# Patient Record
Sex: Female | Born: 1960 | Race: White | Hispanic: No | Smoking: Former smoker
Health system: Southern US, Community
[De-identification: ages and names within clinical notes are randomized; demographics above are authoritative.]

## PROBLEM LIST (undated history)

## (undated) DIAGNOSIS — M1990 Unspecified osteoarthritis, unspecified site: Secondary | ICD-10-CM

## (undated) DIAGNOSIS — S43109A Unspecified dislocation of unspecified acromioclavicular joint, initial encounter: Secondary | ICD-10-CM

## (undated) DIAGNOSIS — D649 Anemia, unspecified: Secondary | ICD-10-CM

## (undated) HISTORY — PX: COLONOSCOPY: SHX174

## (undated) HISTORY — PX: EYE SURGERY: SHX253

## (undated) HISTORY — PX: REFRACTIVE SURGERY: SHX103

---

## 2003-10-13 ENCOUNTER — Other Ambulatory Visit: Admission: RE | Admit: 2003-10-13 | Discharge: 2003-10-13 | Payer: Self-pay | Admitting: Family Medicine

## 2003-10-22 ENCOUNTER — Ambulatory Visit (HOSPITAL_COMMUNITY): Admission: RE | Admit: 2003-10-22 | Discharge: 2003-10-22 | Payer: Self-pay | Admitting: Family Medicine

## 2005-11-09 ENCOUNTER — Observation Stay (HOSPITAL_COMMUNITY): Admission: AD | Admit: 2005-11-09 | Discharge: 2005-11-10 | Payer: Self-pay | Admitting: Obstetrics and Gynecology

## 2005-11-09 ENCOUNTER — Ambulatory Visit: Payer: Self-pay | Admitting: Family Medicine

## 2005-11-16 ENCOUNTER — Ambulatory Visit: Payer: Self-pay | Admitting: Family Medicine

## 2005-11-23 ENCOUNTER — Ambulatory Visit: Payer: Self-pay | Admitting: Family Medicine

## 2005-11-25 ENCOUNTER — Encounter (INDEPENDENT_AMBULATORY_CARE_PROVIDER_SITE_OTHER): Payer: Self-pay | Admitting: Specialist

## 2005-11-25 ENCOUNTER — Inpatient Hospital Stay (HOSPITAL_COMMUNITY): Admission: RE | Admit: 2005-11-25 | Discharge: 2005-11-27 | Payer: Self-pay | Admitting: Obstetrics and Gynecology

## 2005-12-02 ENCOUNTER — Encounter: Admission: RE | Admit: 2005-12-02 | Discharge: 2005-12-31 | Payer: Self-pay | Admitting: Obstetrics and Gynecology

## 2006-01-01 ENCOUNTER — Encounter: Admission: RE | Admit: 2006-01-01 | Discharge: 2006-01-31 | Payer: Self-pay | Admitting: Obstetrics and Gynecology

## 2006-02-01 ENCOUNTER — Encounter: Admission: RE | Admit: 2006-02-01 | Discharge: 2006-03-03 | Payer: Self-pay | Admitting: Obstetrics and Gynecology

## 2006-03-04 ENCOUNTER — Encounter: Admission: RE | Admit: 2006-03-04 | Discharge: 2006-04-02 | Payer: Self-pay | Admitting: Obstetrics and Gynecology

## 2006-04-03 ENCOUNTER — Encounter: Admission: RE | Admit: 2006-04-03 | Discharge: 2006-04-21 | Payer: Self-pay | Admitting: Obstetrics and Gynecology

## 2006-07-05 ENCOUNTER — Ambulatory Visit (HOSPITAL_COMMUNITY): Admission: RE | Admit: 2006-07-05 | Discharge: 2006-07-05 | Payer: Self-pay | Admitting: Family Medicine

## 2007-04-26 ENCOUNTER — Other Ambulatory Visit: Admission: RE | Admit: 2007-04-26 | Discharge: 2007-04-26 | Payer: Self-pay | Admitting: Family Medicine

## 2007-08-06 ENCOUNTER — Ambulatory Visit (HOSPITAL_COMMUNITY): Admission: RE | Admit: 2007-08-06 | Discharge: 2007-08-06 | Payer: Self-pay | Admitting: Family Medicine

## 2010-10-28 NOTE — H&P (Signed)
NAME:  Mary Mahoney, Mary Mahoney               ACCOUNT NO.:  [id number]   MEDICAL RECORD NO.:  [id number]          PATIENT TYPE:  INP   LOCATION:                                FACILITY:  [facility]   PHYSICIAN:  Richardean Sale, M.D.   DATE OF BIRTH:  08-17-1960   DATE OF ADMISSION:  11/25/2005  DATE OF DISCHARGE:                                HISTORY & PHYSICAL   ADMISSION DIAGNOSIS:  36-1/2 week twin intrauterine pregnancy with  intrauterine growth retardation baby B for induction of labor.   HISTORY OF PRESENT ILLNESS:  This is a 50 year old, gravida 3, para 1-0-1-1,  white female with spontaneous conception of a twin intrauterine gestation  monochorionic, diamniotic who presents for induction of labor. Prenatal care  has been at [facility] with Dr. Richardean Sale as the primary  attending. The pregnancy has been uncomplicated up until 34 weeks when the  patient began to have preterm contractions. Ultrasound at that time showed  12% discordance with baby B at the 10th percentile. Up until that point,  growth had been concordant. The patient presented to the office yesterday  for a followup ultrasound for growth and was found to have a 25% discordance  with baby B at the 3rd percentile consistent with IUGR. Amniotic fluid was  normal for both and umbilical cord Doppler was normal for baby A and a the  high end of normal for baby B. The patient received betamethasone at 34  weeks secondary to preterm contractions. Group B beta strep was negative.  Ultrasound yesterday showed vertex-vertex presentation. She presents today  for induction of labor secondary to IUGR of baby B. Fetal surveillance has  been reassuring throughout the pregnancy. Given the patient's advanced  maternal age and twin gestation, the patient underwent consultation at the  [facility] and underwent amniocentesis which showed normal  chromosomes, both female and normal AFP CF screen ws negative. The patient  reports  good fetal movement x2, denies loss of fluid, vaginal bleeding and  complains of just occasional contractions.   REVIEW OF SYSTEMS:  Negative for chest pain, shortness of breath, fever,  chills, sweats, abdominal pain, headache or visual changes.   PRENATAL LABS:  Blood type is A positive, antibody screen negative, rubella  immune, HIV nonreactive, gonorrhea and chlamydia screen negative,  toxoplasmosis screen negative, hepatitis B negative, RPR nonreactive, group  B beta strep negative, one-hour Glucola 118, 28 week hemoglobin 12.4,  hematocrit 36, platelet count 192.   PHYSICAL EXAMINATION:  VITAL SIGNS:  She is afebrile, vital signs are  stable.  GENERAL:  She is a well-developed, well-nourished, white female who appears  in no acute distress. Height is 5 foot 0, weight 159 pounds with a 44 pound  weight gain this pregnancy.  HEART:  Regular rate and rhythm.  LUNGS:  Clear to auscultation bilaterally.  ABDOMEN:  Gravida, soft and nontender.  EXTREMITIES:  No cyanosis, clubbing or pain. Trace edema in the feet  bilaterally.  NEUROLOGIC:  Nonfocal.  CERVIX:  Fingertip 70%, 0 station, vertex, ultrasound confirms vertex-vertex  presentation. Fetal heart rate tracing  is reactive x2 with no decelerations.  Both baby fetuses are able to be traced continuously.   MEDICATIONS:  Prenatal vitamins, folic acid, Ambien p.r.n.   ALLERGIES:  No known drug allergies.   OBSTETRIC HISTORY:  Spontaneous vaginal delivery in 1982, 7 pounds, no  complications. Second pregnancy TAB in 1994, no complications.  Remote  history of abnormal Pap smear, no cone, LEEP or cryo to the cervix.   SOCIAL HISTORY:  Married, denies tobacco, alcohol or drugs.   FAMILY HISTORY:  No history of breast or ovarian cancer. No history of birth  defects, congenital anomalies, cystic fibrosis, mental retardation or  chromosomal abnormalities. Positive for diabetes, coronary artery disease,  hypertension, stroke, in  distant relatives.   ASSESSMENT:  A 50 year old, gravida 3, para 1-0-1-1 white female whose at 79-  1/[redacted] weeks gestation with due date of December 20, 2005 by first trimester  ultrasound who presents for induction of labor secondary to intrauterine  growth retardation of baby B. Vertex-vertex presentation on ultrasound.   PLAN:  1.  Indications for induction reviewed with patient and husband in detail.      The patient and her husband voice understanding. Given vertex-vertex      presentation, the patient would like to attempt vaginal delivery.  2.  Continuous fetal monitoring.  3.  Will start low dose Pitocin and perform amniotomy. Place fetal scalp      electrode if needed.  4.  Plan for delivery in the OR with double setup in the event that      emergency C section of baby B is needed.  5.  Discussed with patient the possibility of baby B converting to breech      given that baby B is the smaller of the two and the patient has a proven      pelvis of 7 pounds, she would like to pursue vaginal breech delivery as      long as there is no fetal intolerance of labor.  6.  Desires permanent sterilization. Will plan for postpartum tubal ligation      after delivery.  7.  Estimated fetal weight on recent ultrasound shows baby A at      approximately 5-1/2 pounds and baby B at 4-1/2 pounds.      Richardean Sale, M.D.  Electronically Signed     JW/MEDQ  D:  11/25/2005  T:  11/25/2005  Job:  [id number]

## 2010-10-28 NOTE — H&P (Signed)
NAME:  Mary Mahoney, Mary Mahoney               ACCOUNT NO.:  [id number]   MEDICAL RECORD NO.:  [id number]          PATIENT TYPE:  INP   LOCATION:  9154                          FACILITY:  [facility]   PHYSICIAN:  Richardean Sale, M.D.   DATE OF BIRTH:  09/07/1960   DATE OF ADMISSION:  11/09/2005  DATE OF DISCHARGE:                                HISTORY & PHYSICAL   ADMITTING DIAGNOSES:  A 34-week intrauterine pregnancy, twin gestation, with  preterm contractions.   HISTORY OF PRESENT ILLNESS:  This is a 50 year old gravida 3, para 1-0-1-1  white female who is at 104 weeks gestation with a known twin intrauterine  pregnancy, diamniotic monochorionic.  The patient's prenatal care has been  at [facility] with Dr. Richardean Sale as the attending, and has been  uncomplicated.  The patient underwent fetal monitoring today, given a 12%  gross discrepancy between the twins, and baby B with an estimated fetal  weight at the 10th percentile.  Both fetal heart rate tracings were  reactive, but the patient was found to be contracting every 2 minutes.  The  patient denies any regular contractions, but has felt occasional menstrual  cramps.  Denies any bleeding, any loss of fluid, or change in vaginal  discharge.  On evaluation in maternity admissions, her cervix as fingertip  dilated, 80% effaced, -1 station posterior, which was a subtle change from  her examination 2 days ago, at which time her cervix was closed.  The  patient underwent observation in maternity admissions, received 1 dose of  Procardia 10 mg and 1 dose of terbutaline, with only a slight decrease in  the frequency of her contractions.  Throughout the monitoring the patient  was able to recognize the contractions, and stated they felt like mild  menstrual cramps.  After the dose of terbutaline, the patient was no longer  able to feel any cramping, but continued to have contractions.  She is being  admitted for observation for rule out preterm  labor.   PAST MEDICAL HISTORY:  No chronic diseases or prior hospitalizations, except  for childbirth.   PAST SURGICAL HISTORY:  None.   OBSTETRIC HISTORY:  Spontaneous vaginal delivery at 42 weeks, uncomplicated,  and first trimester TAB.   GYNECOLOGIC HISTORY:  Remote history of an abnormal Pap smear with no  treatment needed.  No history of gonorrhea, Chlamydia or herpes.   FAMILY HISTORY:  Positive for hypertension, diabetes, coronary artery  disease, emphysema, colon cancer, CVA.  No history of birth defects,  congenital anomalies, Down syndrome, spina bifida or cystic fibrosis.   SOCIAL HISTORY:  Denies tobacco, alcohol or drugs.  She is Estate manager/land agent,  and is an active runner.   MEDICATIONS:  1.  Prenatal vitamins.  2.  Folic acid 1 mg daily.   ALLERGIES:  No known drug allergies.   PHYSICAL EXAMINATION:  VITAL SIGNS:  She is afebrile.  Her vital signs are  stable.  GENERAL:  She is a well-developed, well-nourished, white female who is in no  acute distress.  HEART:  Regular rate and rhythm.  LUNGS:  Clear  to auscultation bilaterally.  ABDOMEN:  Gravid, soft, nontender.  EXTREMITIES:  Only trace edema in the feet bilaterally.  Nontender.  NEUROLOGIC:  Nonfocal.  PELVIC:  Cervix is fingertip, 80%, -1 station, posterior and vertex.  Fetal  heart rate tracing showed a reactive fetal heart rate tracing x2, and  contractions occurring every 2-3 minutes.   PRENATAL LABORATORY STUDIES:  Cystic fibrosis carrier screen negative.  Amniocentesis revealed normal fetal karyotype x2 and normal AFP value x2.  Hepatitis B surface antigen nonreactive.  Blood type A positive.  Antibody  screen negative.  HIV nonreactive.  RPR nonreactive.  Rubella immune.  Urine  culture negative.  Toxoplasmosis screen negative.  Varicella IgG positive,  consistent with prior varicella infections.  One-hour Glucola 118, 28-week  CBC normal with hemoglobin of 12.4, platelet count 192.  Group B beta  strep  is pending.  The most recent ultrasound shows vertex-vertex presentation  with a 12% growth discordance.  Amniotic fluid normal x2.  Baby B at the 9th  to 10th percentile.  Baby A is AGA.  Doppler studies normal x2 on Nov 07, 2005.   ASSESSMENT:  A 50 year old gravida 3, para 1-0-1-1 white female who is at 107  weeks gestation with a diamniotic monochorionic twin gestation with preterm  contractions.   PLAN:  1.  Will admit for observation for rule out preterm labor.  2.  Given patient's gestation of only 34 weeks, will administer      betamethasone 12.5 mg IM x1 now and repeat in 24 hours.  3.  Will start Procardia 10 mg p.o. q.6h. and continue after 48 hours to      achieve steroid time.  4.  Vertex-vertex presentation.  Will plan for vaginal delivery in the OR if      delivery is indicated and fetal heart rate tracing is reactive and able      to trace throughout labor.  5.  Group B beta strep pending.  Will start ampicillin 2 gm IV q.6h. until      results available.  6.  Multiparity.  Advanced maternal age.  The patient requests tubal      sterilization.  Given her multiparity, we discussed the risks, benefits,      and alternatives and possible failure rate of up to 1%.  The patient      voices understanding of the above and desires to proceed with tubal      ligation after delivery.  7.  Daily fetal monitoring.      Richardean Sale, M.D.  Electronically Signed     JW/MEDQ  D:  11/09/2005  T:  11/10/2005  Job:  [id number]

## 2012-04-02 ENCOUNTER — Other Ambulatory Visit: Payer: Self-pay | Admitting: Family Medicine

## 2012-04-02 ENCOUNTER — Other Ambulatory Visit (HOSPITAL_COMMUNITY)
Admission: RE | Admit: 2012-04-02 | Discharge: 2012-04-02 | Disposition: A | Discharge status: Home / Self Care | Payer: Managed Care, Other (non HMO) | Source: Ambulatory Visit | Attending: Family Medicine | Admitting: Family Medicine

## 2012-04-02 ENCOUNTER — Other Ambulatory Visit (HOSPITAL_COMMUNITY): Payer: Self-pay | Admitting: Family Medicine

## 2012-04-02 DIAGNOSIS — Z1231 Encounter for screening mammogram for malignant neoplasm of breast: Secondary | ICD-10-CM

## 2012-04-02 DIAGNOSIS — Z124 Encounter for screening for malignant neoplasm of cervix: Secondary | ICD-10-CM | POA: Insufficient documentation

## 2012-04-03 LAB — TSH: TSH: 1.59 (ref <=5.90)

## 2012-04-19 ENCOUNTER — Ambulatory Visit (HOSPITAL_COMMUNITY)
Admission: RE | Admit: 2012-04-19 | Discharge: 2012-04-19 | Disposition: A | Discharge status: Home / Self Care | Payer: Managed Care, Other (non HMO) | Source: Ambulatory Visit | Attending: Family Medicine | Admitting: Family Medicine

## 2012-04-19 DIAGNOSIS — Z1231 Encounter for screening mammogram for malignant neoplasm of breast: Secondary | ICD-10-CM | POA: Insufficient documentation

## 2012-04-25 ENCOUNTER — Other Ambulatory Visit: Payer: Self-pay | Admitting: Family Medicine

## 2012-04-25 DIAGNOSIS — R928 Other abnormal and inconclusive findings on diagnostic imaging of breast: Secondary | ICD-10-CM

## 2012-05-01 ENCOUNTER — Other Ambulatory Visit: Payer: Self-pay | Admitting: Family Medicine

## 2012-05-01 ENCOUNTER — Ambulatory Visit
Admission: RE | Admit: 2012-05-01 | Discharge: 2012-05-01 | Disposition: A | Discharge status: Home / Self Care | Payer: Managed Care, Other (non HMO) | Source: Ambulatory Visit | Attending: Family Medicine | Admitting: Family Medicine

## 2012-05-01 DIAGNOSIS — R928 Other abnormal and inconclusive findings on diagnostic imaging of breast: Secondary | ICD-10-CM

## 2012-05-14 ENCOUNTER — Ambulatory Visit
Admission: RE | Admit: 2012-05-14 | Discharge: 2012-05-14 | Disposition: A | Discharge status: Home / Self Care | Payer: Managed Care, Other (non HMO) | Source: Ambulatory Visit | Attending: Family Medicine | Admitting: Family Medicine

## 2012-05-14 DIAGNOSIS — R928 Other abnormal and inconclusive findings on diagnostic imaging of breast: Secondary | ICD-10-CM

## 2014-07-31 ENCOUNTER — Other Ambulatory Visit: Payer: Self-pay | Admitting: Family Medicine

## 2014-07-31 ENCOUNTER — Other Ambulatory Visit (HOSPITAL_COMMUNITY)
Admission: RE | Admit: 2014-07-31 | Discharge: 2014-07-31 | Disposition: A | Discharge status: Home / Self Care | Payer: 59 | Source: Ambulatory Visit | Attending: Family Medicine | Admitting: Family Medicine

## 2014-07-31 DIAGNOSIS — Z124 Encounter for screening for malignant neoplasm of cervix: Secondary | ICD-10-CM | POA: Diagnosis not present

## 2014-07-31 DIAGNOSIS — Z1151 Encounter for screening for human papillomavirus (HPV): Secondary | ICD-10-CM | POA: Diagnosis present

## 2014-07-31 LAB — HEPATIC FUNCTION PANEL: BILIRUBIN, TOTAL: 0.7

## 2014-07-31 LAB — BASIC METABOLIC PANEL
BUN: 28 — AB (ref 4–21)
CREATININE: 0.8 (ref 0.5–1.1)
Glucose: 86
Sodium: 139 (ref 137–147)

## 2014-07-31 LAB — LIPID PANEL
CHOLESTEROL: 213 — AB (ref 0–200)
HDL: 64 (ref 35–70)
LDL Cholesterol: 135
Triglycerides: 70 (ref 40–160)

## 2014-08-03 LAB — CYTOLOGY - PAP

## 2014-09-22 HISTORY — PX: COLONOSCOPY: SHX174

## 2014-09-22 LAB — HM COLONOSCOPY

## 2015-03-16 ENCOUNTER — Other Ambulatory Visit (HOSPITAL_COMMUNITY): Payer: Self-pay | Admitting: Orthopaedic Surgery

## 2015-04-08 NOTE — Pre-Procedure Instructions (Signed)
Annabella L [place]  04/08/2015     Your procedure is scheduled on : Wednesday April 21, 2015 at 12:45 PM.  [facility] 10:45 AM.  Call this number if you have problems the morning of surgery: [phone]    Remember:  Do not eat food or drink liquids after midnight.  Take these medicines the morning of surgery with A SIP OF WATER : NONE   Stop taking any vitamins, herbal medications, Naproxen/Aleve, Fish oil, etc on Wednesday November 2nd   Do not wear jewelry, make-up or nail polish.  Do not wear lotions, powders, or perfumes.    Do not shave 48 hours prior to surgery.    Do not bring valuables to the hospital.  [facility] is not responsible for any belongings or valuables.  Contacts, dentures or bridgework may not be worn into surgery.  Leave your suitcase in the car.  After surgery it may be brought to your room.  For patients admitted to the hospital, discharge time will be determined by your treatment team.  Patients discharged the day of surgery will not be allowed to drive home.   Name and phone number of your driver:    Special instructions:  Shower using CHG soap the night before and the morning of your surgery  Please read over the following fact sheets that you were given. Pain Booklet, Coughing and Deep Breathing, Blood Transfusion Information, Total Joint Packet, MRSA Information and Surgical Site Infection Prevention

## 2015-04-09 ENCOUNTER — Encounter (HOSPITAL_COMMUNITY)
Admission: RE | Admit: 2015-04-09 | Discharge: 2015-04-09 | Disposition: A | Discharge status: Home / Self Care | Payer: 59 | Source: Ambulatory Visit | Attending: Orthopaedic Surgery | Admitting: Orthopaedic Surgery

## 2015-04-09 ENCOUNTER — Encounter (HOSPITAL_COMMUNITY): Payer: Self-pay

## 2015-04-09 DIAGNOSIS — Z0183 Encounter for blood typing: Secondary | ICD-10-CM | POA: Insufficient documentation

## 2015-04-09 DIAGNOSIS — M1611 Unilateral primary osteoarthritis, right hip: Secondary | ICD-10-CM | POA: Insufficient documentation

## 2015-04-09 DIAGNOSIS — Z01812 Encounter for preprocedural laboratory examination: Secondary | ICD-10-CM | POA: Insufficient documentation

## 2015-04-09 LAB — TYPE AND SCREEN
ABO/RH(D): A POS
ANTIBODY SCREEN: NEGATIVE

## 2015-04-09 LAB — COMPREHENSIVE METABOLIC PANEL
ALK PHOS: 53 U/L (ref 38–126)
ALT: 22 U/L (ref 14–54)
ANION GAP: 7 (ref 5–15)
AST: 29 U/L (ref 15–41)
Albumin: 4.4 g/dL (ref 3.5–5.0)
BUN: 23 mg/dL — AB (ref 6–20)
CHLORIDE: 102 mmol/L (ref 101–111)
CO2: 27 mmol/L (ref 22–32)
Calcium: 9.3 mg/dL (ref 8.9–10.3)
Creatinine, Ser: 0.87 mg/dL (ref 0.44–1.00)
Glucose, Bld: 86 mg/dL (ref 65–99)
POTASSIUM: 3.9 mmol/L (ref 3.5–5.1)
Sodium: 136 mmol/L (ref 135–145)
Total Bilirubin: 1 mg/dL (ref 0.3–1.2)
Total Protein: 6.6 g/dL (ref 6.5–8.1)

## 2015-04-09 LAB — URINALYSIS, ROUTINE W REFLEX MICROSCOPIC
BILIRUBIN URINE: NEGATIVE
GLUCOSE, UA: NEGATIVE mg/dL
HGB URINE DIPSTICK: NEGATIVE
Ketones, ur: NEGATIVE mg/dL
Leukocytes, UA: NEGATIVE
Nitrite: NEGATIVE
PH: 6.5 (ref 5.0–8.0)
Protein, ur: NEGATIVE mg/dL
SPECIFIC GRAVITY, URINE: 1.025 (ref 1.005–1.030)
Urobilinogen, UA: 0.2 mg/dL (ref 0.0–1.0)

## 2015-04-09 LAB — CBC WITH DIFFERENTIAL/PLATELET
BASOS ABS: 0 10*3/uL (ref 0.0–0.1)
Basophils Relative: 1 %
EOS PCT: 2 %
Eosinophils Absolute: 0.1 10*3/uL (ref 0.0–0.7)
HCT: 37.3 % (ref 36.0–46.0)
HEMOGLOBIN: 12.6 g/dL (ref 12.0–15.0)
LYMPHS ABS: 2.1 10*3/uL (ref 0.7–4.0)
LYMPHS PCT: 47 %
MCH: 30.4 pg (ref 26.0–34.0)
MCHC: 33.8 g/dL (ref 30.0–36.0)
MCV: 89.9 fL (ref 78.0–100.0)
Monocytes Absolute: 0.4 10*3/uL (ref 0.1–1.0)
Monocytes Relative: 8 %
NEUTROS ABS: 1.9 10*3/uL (ref 1.7–7.7)
NEUTROS PCT: 42 %
PLATELETS: 198 10*3/uL (ref 150–400)
RBC: 4.15 MIL/uL (ref 3.87–5.11)
RDW: 12.3 % (ref 11.5–15.5)
WBC: 4.6 10*3/uL (ref 4.0–10.5)

## 2015-04-09 LAB — C-REACTIVE PROTEIN: CRP: <0.5 mg/dL (ref <1.0)

## 2015-04-09 LAB — APTT: APTT: 24 s (ref 24–37)

## 2015-04-09 LAB — PROTIME-INR
INR: 1.03 (ref 0.00–1.49)
PROTHROMBIN TIME: 13.7 s (ref 11.6–15.2)

## 2015-04-09 LAB — SURGICAL PCR SCREEN
MRSA, PCR: NEGATIVE
STAPHYLOCOCCUS AUREUS: NEGATIVE

## 2015-04-09 LAB — SEDIMENTATION RATE: Sed Rate: 1 mm/hr (ref 0–22)

## 2015-04-09 LAB — ABO/RH: ABO/RH(D): A POS

## 2015-04-09 MED ORDER — BUPIVACAINE LIPOSOME 1.3 % IJ SUSP: 20.0000 mL | Freq: Once | INTRAMUSCULAR | Status: DC

## 2015-04-09 NOTE — Progress Notes (Signed)
PCP is Shirlean Mylararol Webb  Patient denied having any past surgical or medical history. Patient stated "I'm very healthy."  Nurse inquired about patients blood pressure being 99/44, and patient stated she thought it was normal for her because she is a runner, and works out a lot.

## 2015-04-13 HISTORY — PX: JOINT REPLACEMENT: SHX530

## 2015-04-20 MED ORDER — TRANEXAMIC ACID 1000 MG/10ML IV SOLN
1000.0000 mg | INTRAVENOUS | Status: AC
  Administered 2015-04-21: 1000 mg via INTRAVENOUS
  Filled 2015-04-20: qty 10

## 2015-04-21 ENCOUNTER — Encounter (HOSPITAL_COMMUNITY)
Admission: RE | Disposition: A | Discharge status: Home Health | Payer: Self-pay | Source: Ambulatory Visit | Attending: Orthopaedic Surgery

## 2015-04-21 ENCOUNTER — Inpatient Hospital Stay (HOSPITAL_COMMUNITY): Payer: 59

## 2015-04-21 ENCOUNTER — Inpatient Hospital Stay (HOSPITAL_COMMUNITY)
Admission: RE | Admit: 2015-04-21 | Discharge: 2015-04-23 | DRG: 470 | Disposition: A | Discharge status: Home Health | Payer: 59 | Source: Ambulatory Visit | Attending: Orthopaedic Surgery | Admitting: Orthopaedic Surgery

## 2015-04-21 ENCOUNTER — Encounter (HOSPITAL_COMMUNITY): Payer: Self-pay | Admitting: *Deleted

## 2015-04-21 ENCOUNTER — Inpatient Hospital Stay (HOSPITAL_COMMUNITY): Payer: 59 | Admitting: Anesthesiology

## 2015-04-21 DIAGNOSIS — D62 Acute posthemorrhagic anemia: Secondary | ICD-10-CM | POA: Diagnosis not present

## 2015-04-21 DIAGNOSIS — Z87891 Personal history of nicotine dependence: Secondary | ICD-10-CM | POA: Diagnosis not present

## 2015-04-21 DIAGNOSIS — M1611 Unilateral primary osteoarthritis, right hip: Principal | ICD-10-CM | POA: Diagnosis present

## 2015-04-21 DIAGNOSIS — M25551 Pain in right hip: Secondary | ICD-10-CM | POA: Diagnosis present

## 2015-04-21 DIAGNOSIS — Z96649 Presence of unspecified artificial hip joint: Secondary | ICD-10-CM

## 2015-04-21 DIAGNOSIS — Z79899 Other long term (current) drug therapy: Secondary | ICD-10-CM

## 2015-04-21 DIAGNOSIS — M1610 Unilateral primary osteoarthritis, unspecified hip: Secondary | ICD-10-CM | POA: Diagnosis present

## 2015-04-21 DIAGNOSIS — Z419 Encounter for procedure for purposes other than remedying health state, unspecified: Secondary | ICD-10-CM

## 2015-04-21 HISTORY — PX: TOTAL HIP ARTHROPLASTY: SHX124

## 2015-04-21 SURGERY — ARTHROPLASTY, HIP, TOTAL, ANTERIOR APPROACH
Anesthesia: Monitor Anesthesia Care | Site: Hip | Laterality: Right

## 2015-04-21 MED ORDER — ALBUMIN HUMAN 5 % IV SOLN
INTRAVENOUS | Status: DC | PRN
  Administered 2015-04-21: 14:00:00 via INTRAVENOUS

## 2015-04-21 MED ORDER — METHOCARBAMOL 1000 MG/10ML IJ SOLN
500.0000 mg | Freq: Four times a day (QID) | INTRAVENOUS | Status: DC | PRN
  Filled 2015-04-21: qty 5

## 2015-04-21 MED ORDER — HYDROMORPHONE HCL 1 MG/ML IJ SOLN
INTRAMUSCULAR | Status: AC
  Administered 2015-04-21: 0.5 mg via INTRAVENOUS
  Filled 2015-04-21: qty 1

## 2015-04-21 MED ORDER — METHOCARBAMOL 500 MG PO TABS
ORAL_TABLET | ORAL | Status: AC
  Filled 2015-04-21: qty 1

## 2015-04-21 MED ORDER — EPHEDRINE SULFATE 50 MG/ML IJ SOLN
INTRAMUSCULAR | Status: DC | PRN
  Administered 2015-04-21 (×3): 5 mg via INTRAVENOUS
  Administered 2015-04-21: 10 mg via INTRAVENOUS
  Administered 2015-04-21: 5 mg via INTRAVENOUS
  Administered 2015-04-21 (×2): 10 mg via INTRAVENOUS

## 2015-04-21 MED ORDER — 0.9 % SODIUM CHLORIDE (POUR BTL) OPTIME
TOPICAL | Status: DC | PRN
  Administered 2015-04-21: 1000 mL

## 2015-04-21 MED ORDER — OXYCODONE HCL 5 MG PO TABS: 5.0000 mg | ORAL_TABLET | ORAL | Status: DC | PRN

## 2015-04-21 MED ORDER — SODIUM CHLORIDE 0.9 % IV SOLN
INTRAVENOUS | Status: DC
  Administered 2015-04-21: 125 mL/h via INTRAVENOUS
  Administered 2015-04-21 – 2015-04-22 (×2): via INTRAVENOUS

## 2015-04-21 MED ORDER — CELECOXIB 200 MG PO CAPS
200.0000 mg | ORAL_CAPSULE | Freq: Two times a day (BID) | ORAL | Status: DC
  Administered 2015-04-21 – 2015-04-23 (×4): 200 mg via ORAL
  Filled 2015-04-21 (×4): qty 1

## 2015-04-21 MED ORDER — LIDOCAINE HCL (CARDIAC) 20 MG/ML IV SOLN
INTRAVENOUS | Status: AC
  Filled 2015-04-21: qty 5

## 2015-04-21 MED ORDER — HYDROMORPHONE HCL 1 MG/ML IJ SOLN
0.2500 mg | INTRAMUSCULAR | Status: DC | PRN
  Administered 2015-04-21 (×2): 0.5 mg via INTRAVENOUS

## 2015-04-21 MED ORDER — ACETAMINOPHEN 325 MG PO TABS: 650.0000 mg | ORAL_TABLET | Freq: Four times a day (QID) | ORAL | Status: DC | PRN

## 2015-04-21 MED ORDER — OXYCODONE HCL 5 MG PO TABS
ORAL_TABLET | ORAL | Status: AC
  Administered 2015-04-22: 10 mg via ORAL
  Filled 2015-04-21: qty 2

## 2015-04-21 MED ORDER — OXYCODONE HCL ER 10 MG PO T12A: 10.0000 mg | EXTENDED_RELEASE_TABLET | Freq: Two times a day (BID) | ORAL | Status: DC

## 2015-04-21 MED ORDER — ONDANSETRON HCL 4 MG PO TABS
4.0000 mg | ORAL_TABLET | Freq: Four times a day (QID) | ORAL | Status: DC | PRN
  Administered 2015-04-23: 4 mg via ORAL
  Filled 2015-04-21: qty 1

## 2015-04-21 MED ORDER — POLYETHYLENE GLYCOL 3350 17 G PO PACK: 17.0000 g | PACK | Freq: Every day | ORAL | Status: DC | PRN

## 2015-04-21 MED ORDER — LACTATED RINGERS IV SOLN
INTRAVENOUS | Status: DC
  Administered 2015-04-21 (×3): via INTRAVENOUS

## 2015-04-21 MED ORDER — MIDAZOLAM HCL 2 MG/2ML IJ SOLN
INTRAMUSCULAR | Status: AC
  Filled 2015-04-21: qty 2

## 2015-04-21 MED ORDER — MIDAZOLAM HCL 5 MG/5ML IJ SOLN
INTRAMUSCULAR | Status: DC | PRN
  Administered 2015-04-21 (×2): 1 mg via INTRAVENOUS

## 2015-04-21 MED ORDER — CEFAZOLIN SODIUM-DEXTROSE 2-3 GM-% IV SOLR
2.0000 g | Freq: Four times a day (QID) | INTRAVENOUS | Status: AC
  Administered 2015-04-21 – 2015-04-22 (×2): 2 g via INTRAVENOUS
  Filled 2015-04-21 (×2): qty 50

## 2015-04-21 MED ORDER — METOCLOPRAMIDE HCL 5 MG/ML IJ SOLN
5.0000 mg | Freq: Three times a day (TID) | INTRAMUSCULAR | Status: DC | PRN
Start: 2015-04-21 — End: 2015-04-23
  Administered 2015-04-22: 10 mg via INTRAVENOUS
  Filled 2015-04-21: qty 2

## 2015-04-21 MED ORDER — FENTANYL CITRATE (PF) 100 MCG/2ML IJ SOLN
INTRAMUSCULAR | Status: DC | PRN
  Administered 2015-04-21 (×2): 50 ug via INTRAVENOUS

## 2015-04-21 MED ORDER — DIPHENHYDRAMINE HCL 12.5 MG/5ML PO ELIX: 25.0000 mg | ORAL_SOLUTION | ORAL | Status: DC | PRN

## 2015-04-21 MED ORDER — MORPHINE SULFATE (PF) 2 MG/ML IV SOLN
1.0000 mg | INTRAVENOUS | Status: DC | PRN
  Administered 2015-04-21 (×2): 1 mg via INTRAVENOUS
  Filled 2015-04-21 (×2): qty 1

## 2015-04-21 MED ORDER — FENTANYL CITRATE (PF) 250 MCG/5ML IJ SOLN
INTRAMUSCULAR | Status: AC
  Filled 2015-04-21: qty 5

## 2015-04-21 MED ORDER — METHOCARBAMOL 500 MG PO TABS
500.0000 mg | ORAL_TABLET | Freq: Four times a day (QID) | ORAL | Status: DC | PRN
  Administered 2015-04-21 – 2015-04-23 (×4): 500 mg via ORAL
  Filled 2015-04-21 (×3): qty 1

## 2015-04-21 MED ORDER — ONDANSETRON HCL 4 MG/2ML IJ SOLN
4.0000 mg | Freq: Four times a day (QID) | INTRAMUSCULAR | Status: DC | PRN
  Administered 2015-04-22: 4 mg via INTRAVENOUS
  Filled 2015-04-21: qty 2

## 2015-04-21 MED ORDER — CEFAZOLIN SODIUM-DEXTROSE 2-3 GM-% IV SOLR
2.0000 g | INTRAVENOUS | Status: AC
  Administered 2015-04-21: 2 g via INTRAVENOUS

## 2015-04-21 MED ORDER — ASPIRIN EC 325 MG PO TBEC: 325.0000 mg | DELAYED_RELEASE_TABLET | Freq: Two times a day (BID) | ORAL | Status: DC

## 2015-04-21 MED ORDER — MENTHOL 3 MG MT LOZG: 1.0000 | LOZENGE | OROMUCOSAL | Status: DC | PRN

## 2015-04-21 MED ORDER — ALUM & MAG HYDROXIDE-SIMETH 200-200-20 MG/5ML PO SUSP: 30.0000 mL | ORAL | Status: DC | PRN

## 2015-04-21 MED ORDER — ONDANSETRON HCL 4 MG/2ML IJ SOLN: INTRAMUSCULAR | Status: DC | PRN

## 2015-04-21 MED ORDER — ACETAMINOPHEN 650 MG RE SUPP: 650.0000 mg | Freq: Four times a day (QID) | RECTAL | Status: DC | PRN

## 2015-04-21 MED ORDER — CHLORHEXIDINE GLUCONATE 4 % EX LIQD: 60.0000 mL | Freq: Once | CUTANEOUS | Status: DC

## 2015-04-21 MED ORDER — PHENYLEPHRINE HCL 10 MG/ML IJ SOLN
INTRAMUSCULAR | Status: DC | PRN
  Administered 2015-04-21: 40 ug via INTRAVENOUS
  Administered 2015-04-21: 80 ug via INTRAVENOUS
  Administered 2015-04-21: 40 ug via INTRAVENOUS

## 2015-04-21 MED ORDER — ASPIRIN EC 325 MG PO TBEC
325.0000 mg | DELAYED_RELEASE_TABLET | Freq: Two times a day (BID) | ORAL | Status: DC
  Administered 2015-04-21 – 2015-04-23 (×4): 325 mg via ORAL
  Filled 2015-04-21 (×3): qty 1

## 2015-04-21 MED ORDER — PROMETHAZINE HCL 25 MG/ML IJ SOLN: 6.2500 mg | INTRAMUSCULAR | Status: DC | PRN

## 2015-04-21 MED ORDER — METOCLOPRAMIDE HCL 5 MG PO TABS: 5.0000 mg | ORAL_TABLET | Freq: Three times a day (TID) | ORAL | Status: DC | PRN

## 2015-04-21 MED ORDER — ACETAMINOPHEN 500 MG PO TABS
1000.0000 mg | ORAL_TABLET | Freq: Four times a day (QID) | ORAL | Status: AC
  Administered 2015-04-21 – 2015-04-22 (×4): 1000 mg via ORAL
  Filled 2015-04-21 (×4): qty 2

## 2015-04-21 MED ORDER — BUPIVACAINE IN DEXTROSE 0.75-8.25 % IT SOLN
INTRATHECAL | Status: DC | PRN
  Administered 2015-04-21: 12 mg via INTRATHECAL

## 2015-04-21 MED ORDER — CEFAZOLIN SODIUM-DEXTROSE 2-3 GM-% IV SOLR
INTRAVENOUS | Status: AC
Start: 2015-04-21 — End: 2015-04-21
  Filled 2015-04-21: qty 50

## 2015-04-21 MED ORDER — PROPOFOL 10 MG/ML IV BOLUS
INTRAVENOUS | Status: DC | PRN
  Administered 2015-04-21 (×2): 10 mg via INTRAVENOUS

## 2015-04-21 MED ORDER — PHENOL 1.4 % MT LIQD: 1.0000 | OROMUCOSAL | Status: DC | PRN

## 2015-04-21 MED ORDER — OXYCODONE HCL 5 MG PO TABS
5.0000 mg | ORAL_TABLET | ORAL | Status: DC | PRN
  Administered 2015-04-21 – 2015-04-22 (×6): 10 mg via ORAL
  Filled 2015-04-21 (×5): qty 2

## 2015-04-21 MED ORDER — SODIUM CHLORIDE 0.9 % IR SOLN
Status: DC | PRN
  Administered 2015-04-21: 3000 mL
  Administered 2015-04-21: 1000 mL

## 2015-04-21 MED ORDER — ONDANSETRON HCL 4 MG/2ML IJ SOLN
INTRAMUSCULAR | Status: DC | PRN
  Administered 2015-04-21: 4 mg via INTRAVENOUS

## 2015-04-21 MED ORDER — PROPOFOL 10 MG/ML IV BOLUS
INTRAVENOUS | Status: AC
  Filled 2015-04-21: qty 20

## 2015-04-21 MED ORDER — OXYCODONE HCL ER 10 MG PO T12A
10.0000 mg | EXTENDED_RELEASE_TABLET | Freq: Two times a day (BID) | ORAL | Status: DC
  Administered 2015-04-21 – 2015-04-22 (×2): 10 mg via ORAL
  Filled 2015-04-21 (×2): qty 1

## 2015-04-21 MED ORDER — PROPOFOL 500 MG/50ML IV EMUL
INTRAVENOUS | Status: DC | PRN
  Administered 2015-04-21: 75 ug/kg/min via INTRAVENOUS

## 2015-04-21 SURGICAL SUPPLY — 52 items
BLADE SAW SGTL 18X1.27X75 (BLADE) ×2 IMPLANT
BNDG COHESIVE 6X5 TAN STRL LF (GAUZE/BANDAGES/DRESSINGS) ×2 IMPLANT
CAPT HIP TOTAL 2 ×2 IMPLANT
CELLS DAT CNTRL 66122 CELL SVR (MISCELLANEOUS) ×1 IMPLANT
CLSR STERI-STRIP ANTIMIC 1/2X4 (GAUZE/BANDAGES/DRESSINGS) ×2 IMPLANT
COVER SURGICAL LIGHT HANDLE (MISCELLANEOUS) ×2 IMPLANT
DERMABOND ADVANCED (GAUZE/BANDAGES/DRESSINGS) ×1
DERMABOND ADVANCED .7 DNX12 (GAUZE/BANDAGES/DRESSINGS) ×1 IMPLANT
DRAPE C-ARM 42X72 X-RAY (DRAPES) ×2 IMPLANT
DRAPE IMP U-DRAPE 54X76 (DRAPES) ×2 IMPLANT
DRAPE STERI IOBAN 125X83 (DRAPES) ×2 IMPLANT
DRAPE U-SHAPE 47X51 STRL (DRAPES) ×6 IMPLANT
DRSG AQUACEL AG ADV 3.5X10 (GAUZE/BANDAGES/DRESSINGS) ×2 IMPLANT
DRSG MEPILEX BORDER 4X8 (GAUZE/BANDAGES/DRESSINGS) IMPLANT
DURAPREP 26ML APPLICATOR (WOUND CARE) ×2 IMPLANT
ELECT BLADE 4.0 EZ CLEAN MEGAD (MISCELLANEOUS) ×2
ELECT REM PT RETURN 9FT ADLT (ELECTROSURGICAL) ×2
ELECTRODE BLDE 4.0 EZ CLN MEGD (MISCELLANEOUS) ×1 IMPLANT
ELECTRODE REM PT RTRN 9FT ADLT (ELECTROSURGICAL) ×1 IMPLANT
FACESHIELD WRAPAROUND (MASK) IMPLANT
GLOVE NEODERM STRL 7.5 LF PF (GLOVE) ×1 IMPLANT
GLOVE SURG NEODERM 7.5  LF PF (GLOVE) ×1
GLOVE SURG SYN 7.5  E (GLOVE) ×2
GLOVE SURG SYN 7.5 E (GLOVE) ×2 IMPLANT
GOWN SRG XL XLNG 56XLVL 4 (GOWN DISPOSABLE) ×1 IMPLANT
GOWN STRL NON-REIN XL XLG LVL4 (GOWN DISPOSABLE) ×1
GOWN STRL REUS W/ TWL LRG LVL3 (GOWN DISPOSABLE) ×4 IMPLANT
GOWN STRL REUS W/TWL LRG LVL3 (GOWN DISPOSABLE) ×4
HANDPIECE INTERPULSE COAX TIP (DISPOSABLE) ×1
HOOD PEEL AWAY FACE SHEILD DIS (HOOD) ×2 IMPLANT
KIT BASIN OR (CUSTOM PROCEDURE TRAY) ×2 IMPLANT
MARKER SKIN DUAL TIP RULER LAB (MISCELLANEOUS) ×2 IMPLANT
PACK TOTAL JOINT (CUSTOM PROCEDURE TRAY) ×2 IMPLANT
PACK UNIVERSAL I (CUSTOM PROCEDURE TRAY) ×2 IMPLANT
PADDING CAST COTTON 6X4 STRL (CAST SUPPLIES) IMPLANT
RTRCTR WOUND ALEXIS 18CM MED (MISCELLANEOUS) ×2
SEALER BIPOLAR AQUA 6.0 (INSTRUMENTS) ×2 IMPLANT
SET HNDPC FAN SPRY TIP SCT (DISPOSABLE) ×1 IMPLANT
SOLUTION BETADINE 4OZ (MISCELLANEOUS) ×2 IMPLANT
SUT ETHIBOND 2 V 37 (SUTURE) ×2 IMPLANT
SUT ETHIBOND NAB CT1 #1 30IN (SUTURE) IMPLANT
SUT ETHILON 3 0 FSL (SUTURE) ×2 IMPLANT
SUT MNCRL AB 4-0 PS2 18 (SUTURE) ×2 IMPLANT
SUT VIC AB 0 CT1 27 (SUTURE) ×1
SUT VIC AB 0 CT1 27XBRD ANBCTR (SUTURE) ×1 IMPLANT
SUT VIC AB 1 CT1 27 (SUTURE) ×2
SUT VIC AB 1 CT1 27XBRD ANBCTR (SUTURE) ×2 IMPLANT
SUT VIC AB 2-0 CT1 27 (SUTURE) ×2
SUT VIC AB 2-0 CT1 TAPERPNT 27 (SUTURE) ×2 IMPLANT
SYR 20CC LL (SYRINGE) ×2 IMPLANT
TOWEL OR 17X26 10 PK STRL BLUE (TOWEL DISPOSABLE) ×2 IMPLANT
TRAY FOLEY CATH 16FR SILVER (SET/KITS/TRAYS/PACK) ×2 IMPLANT

## 2015-04-21 NOTE — Anesthesia Postprocedure Evaluation (Signed)
Anesthesia Post Note  Patient: Mary Mahoney  Procedure(s) Performed: Procedure(s) (LRB): RIGHT TOTAL HIP ARTHROPLASTY ANTERIOR APPROACH (Right)  Anesthesia type: MAC/SAB  Patient location: PACU  Post pain: Pain level controlled  Post assessment: Patient's Cardiovascular Status Stable, SAB receding  Last Vitals:  Filed Vitals:   04/21/15 1445  BP:   Pulse: 57  Temp:   Resp: 14    Post vital signs: Reviewed and stable  Level of consciousness: sedated  Complications: No apparent anesthesia complications

## 2015-04-21 NOTE — Anesthesia Procedure Notes (Addendum)
Spinal Patient location during procedure: OR Start time: 04/21/2015 11:42 AM End time: 04/21/2015 11:52 AM Staffing Anesthesiologist: Heather RobertsSINGER, JAMES Performed by: anesthesiologist  Preanesthetic Checklist Completed: patient identified, surgical consent, pre-op evaluation, timeout performed, IV checked, risks and benefits discussed and monitors and equipment checked Spinal Block Patient position: sitting Prep: DuraPrep Patient monitoring: cardiac monitor, continuous pulse ox and blood pressure Approach: midline Location: L2-3 Injection technique: single-shot Needle Needle type: Pencan  Needle gauge: 24 G Needle length: 9 cm Additional Notes Functioning IV was confirmed and monitors were applied. Sterile prep and drape, including hand hygiene and sterile gloves were used. The patient was positioned and the spine was prepped. The skin was anesthetized with lidocaine.  Free flow of clear CSF was obtained prior to injecting local anesthetic into the CSF.  The spinal needle aspirated freely following injection.  The needle was carefully withdrawn.  The patient tolerated the procedure well.   Procedure Name: MAC Date/Time: 04/21/2015 11:59 AM Performed by: Leonel Ramsay'LAUGHLIN, Kamilya Wakeman H Pre-anesthesia Checklist: Patient identified, Emergency Drugs available, Suction available and Patient being monitored Patient Re-evaluated:Patient Re-evaluated prior to inductionOxygen Delivery Method: Simple face mask Preoxygenation: Pre-oxygenation with 100% oxygen Dental Injury: Teeth and Oropharynx as per pre-operative assessment

## 2015-04-21 NOTE — Op Note (Signed)
RIGHT TOTAL HIP ARTHROPLASTY ANTERIOR APPROACH  Procedure Note Mary Mahoney   [id number]  Pre-op Diagnosis: Right hip osteoarthritis     Post-op Diagnosis: same   Operative Procedures  1. Total hip replacement; Right hip; uncemented [id number]   Personnel  Surgeon(s): Tarry Kos, MD   Anesthesia: spinal, epidural   Prosthesis: Depuy Acetabulum: Pinnacle 50 mm Femur: Corail KLA 9 Head: 32 mm size: +1 Bearing Type: Ceramic on poly  Date of Service: 04/21/2015  Total Hip Arthroplasty (Anterior Approach) Op Note:  After informed consent was obtained and the operative extremity marked in the holding area, the patient was brought back to the operating room and placed supine on the HANA table. Next, the operative extremity was prepped and draped in normal sterile fashion. Surgical timeout occurred verifying patient identification, surgical site, surgical procedure and administration of antibiotics.  A modified anterior Smith-Peterson approach to the hip was performed, using the interval between tensor fascia lata and sartorius.  Dissection was carried bluntly down onto the anterior hip capsule. The lateral femoral circumflex vessels were identified and coagulated. A capsulotomy was performed and the capsular flaps tagged for later repair.  Fluoroscopy was utilized to prepare for the femoral neck cut. The neck osteotomy was performed. The femoral head was removed, the acetabular rim was cleared of soft tissue and attention was turned to reaming the acetabulum.  Sequential reaming was performed under fluoroscopic guidance. We reamed to a size 49 mm, and then impacted the acetabular shell. The liner was then placed after irrigation and attention turned to the femur.  After placing the femoral hook, the leg was taken to externally rotated, extended and adducted position taking care to perform soft tissue releases to allow for adequate mobilization of the femur. Soft tissue was cleared from the  shoulder of the greater trochanter and the hook elevator used to improve exposure of the proximal femur. Sequential broaching performed up to a size 10. Trial neck and head were placed. The leg was brought back up to neutral and the construct reduced. The position and sizing of components, offset and leg lengths were checked using fluoroscopy. Stability of the construct was checked in extension and external rotation without any subluxation or impingement of prosthesis. We dislocated the prosthesis, dropped the leg back into position, removed trial components, and irrigated copiously. The final stem and head was then placed, the leg brought back up, the system reduced and fluoroscopy used to verify positioning.  We irrigated, obtained hemostasis and closed the capsule using #2 ethibond suture.  Dilute betadyne solution was used. The fascia was closed with #1 vicryl plus, the deep fat layer was closed with 0 vicryl, the subcutaneous layers closed with 2.0 Vicryl Plus and the skin closed with 4.0 monocryl and dermabond. A sterile dressing was applied. The patient was awakened in the operating room and taken to recovery in stable condition.  All sponge, needle, and instrument counts were correct at the end of the case.   Position: supine  Complications: none.  Time Out: performed   Drains/Packing: none  Estimated blood loss: 200 cc  Returned to Recovery Room: in good condition.   Antibiotics: yes   Mechanical VTE (DVT) Prophylaxis: sequential compression devices, TED thigh-high  Chemical VTE (DVT) Prophylaxis: aspirin   Fluid Replacement: see anesthesia record  Specimens Removed: 1 to pathology   Sponge and Instrument Count Correct? yes   PACU: portable radiograph - low AP   Admission: inpatient status, start PT & OT POD#1  Plan/RTC: Return in 2 weeks for staple removal. Return in 6 weeks to see MD.  Weight Bearing/Load Lower Extremity: full  Hip precautions: none Suture Removal: 10-14  days  Betadine to incision twice daily once dressing is removed on POD#7  [name], MD [facility] [phone] 2:01 PM      Implant Name Type Inv. Item Serial No. Manufacturer Lot No. LRB No. Used  PINNACLE PLUS 4 NEUTRAL - [id number] Hips PINNACLE PLUS 4 NEUTRAL  DEPUY [id number] Right 1  CUP SECTOR GRIPTON 50MM - [id number] Cup CUP SECTOR GRIPTON 50MM  DEPUY [id number] Right 1  APEX HOLE ELIM DEPUY - [id number] Hips APEX HOLE ELIM DEPUY  DEPUY [id number] Right 1  SCREW 6.5MMX25MM - [id number] Screw SCREW 6.5MMX25MM  DEPUY [id number] Right 1  STEM CORAIL KLA09 - [id number] Stem STEM CORAIL KLA09  DEPUY [id number] Right 1  STEM CORAIL KLA09 - [id number] Stem STEM CORAIL KLA09  DEPUY [id number] Right 1  SROM M HEAD 32MM PLUS 1 - [id number] Hips SROM M HEAD 32MM PLUS 1   DEPUY [id number] Right 1

## 2015-04-21 NOTE — Anesthesia Preprocedure Evaluation (Signed)
Anesthesia Evaluation  Patient identified by MRN, date of birth, ID band Patient awake    Reviewed: Allergy & Precautions, NPO status , Patient's Chart, lab work & pertinent test results  History of Anesthesia Complications Negative for: history of anesthetic complications  Airway Mallampati: II  TM Distance: >3 FB Neck ROM: Full    Dental  (+) Teeth Intact, Dental Advisory Given   Pulmonary former smoker,    Pulmonary exam normal        Cardiovascular negative cardio ROS Normal cardiovascular exam     Neuro/Psych negative neurological ROS  negative psych ROS   GI/Hepatic negative GI ROS, Neg liver ROS,   Endo/Other  negative endocrine ROS  Renal/GU negative Renal ROS     Musculoskeletal   Abdominal   Peds  Hematology   Anesthesia Other Findings   Reproductive/Obstetrics                             Anesthesia Physical Anesthesia Plan  ASA: I  Anesthesia Plan: MAC and Spinal   Post-op Pain Management:    Induction:   Airway Management Planned: Simple Face Mask  Additional Equipment:   Intra-op Plan:   Post-operative Plan:   Informed Consent: I have reviewed the patients History and Physical, chart, labs and discussed the procedure including the risks, benefits and alternatives for the proposed anesthesia with the patient or authorized representative who has indicated his/her understanding and acceptance.   Dental advisory given  Plan Discussed with: CRNA, Anesthesiologist and Surgeon  Anesthesia Plan Comments:         Anesthesia Quick Evaluation

## 2015-04-21 NOTE — Transfer of Care (Signed)
Immediate Anesthesia Transfer of Care Note  Patient: Mary Mahoney  Procedure(s) Performed: Procedure(s): RIGHT TOTAL HIP ARTHROPLASTY ANTERIOR APPROACH (Right)  Patient Location: PACU  Anesthesia Type:MAC and Spinal  Level of Consciousness: awake, alert  and oriented  Airway & Oxygen Therapy: Patient Spontanous Breathing  Post-op Assessment: Report given to RN and Post -op Vital signs reviewed and stable  Post vital signs: Reviewed and stable  Last Vitals:  Filed Vitals:   04/21/15 1413  BP: 88/53  Pulse: 66  Temp: 36.4 C  Resp: 20    Complications: No apparent anesthesia complications

## 2015-04-21 NOTE — H&P (Signed)
    PREOPERATIVE H&P  Chief Complaint: Right hip osteoarthritis  HPI: Mary Mahoney is a 54 y.o. female who presents for surgical treatment of Right hip osteoarthritis.  She denies any changes in medical history.  No past medical history on file. Past Surgical History  Procedure Laterality Date  . Colonoscopy    . Eye surgery Bilateral     Lasik surgery   Social History   Social History  . Marital Status: Single    Spouse Name: N/A  . Number of Children: N/A  . Years of Education: N/A   Social History Main Topics  . Smoking status: Former Games developermoker  . Smokeless tobacco: Not on file  . Alcohol Use: Yes     Comment: with dinner once a week; occasional  . Drug Use: No  . Sexual Activity: Not on file   Other Topics Concern  . Not on file   Social History Narrative  . No narrative on file   No family history on file. No Known Allergies Prior to Admission medications   Medication Sig Start Date End Date Taking? Authorizing Provider  Calcium Carb-Cholecalciferol (CALCIUM 600 + D PO) Take 1 tablet by mouth daily.   Yes Historical Provider, MD  Chromium 200 MCG CAPS Take 1 capsule by mouth daily.   Yes Historical Provider, MD  Multiple Vitamin (MULTIVITAMIN WITH MINERALS) TABS tablet Take 3 tablets by mouth daily.   Yes Historical Provider, MD  Multiple Vitamins-Minerals (ANTIOXIDANT PO) Take 1 capsule by mouth daily.   Yes Historical Provider, MD  naproxen sodium (ANAPROX) 220 MG tablet Take 440 mg by mouth 2 (two) times daily.   Yes Historical Provider, MD  NON FORMULARY Take 1 capsule by mouth daily. Cellular health   Yes Historical Provider, MD  Omega-3 Fatty Acids (FISH OIL PO) Take 1 capsule by mouth daily.   Yes Historical Provider, MD  TURMERIC PO Take 1 tablet by mouth daily.   Yes Historical Provider, MD     Positive ROS: All other systems have been reviewed and were otherwise negative with the exception of those mentioned in the HPI and as above.  Physical  Exam: General: Alert, no acute distress Cardiovascular: No pedal edema Respiratory: No cyanosis, no use of accessory musculature GI: abdomen soft Skin: No lesions in the area of chief complaint Neurologic: Sensation intact distally Psychiatric: Patient is competent for consent with normal mood and affect Lymphatic: no lymphedema  MUSCULOSKELETAL: exam stable  Assessment: Right hip osteoarthritis  Plan: Plan for Procedure(s): RIGHT TOTAL HIP ARTHROPLASTY ANTERIOR APPROACH  The risks benefits and alternatives were discussed with the patient including but not limited to the risks of nonoperative treatment, versus surgical intervention including infection, bleeding, nerve injury,  blood clots, cardiopulmonary complications, morbidity, mortality, among others, and they were willing to proceed.   Cheral AlmasXu, Naiping Michael, MD   04/21/2015 7:29 AM

## 2015-04-22 ENCOUNTER — Encounter (HOSPITAL_COMMUNITY): Payer: Self-pay | Admitting: Orthopaedic Surgery

## 2015-04-22 LAB — BASIC METABOLIC PANEL
Anion gap: 5 (ref 5–15)
BUN: 17 mg/dL (ref 6–20)
CHLORIDE: 103 mmol/L (ref 101–111)
CO2: 26 mmol/L (ref 22–32)
CREATININE: 0.81 mg/dL (ref 0.44–1.00)
Calcium: 8.3 mg/dL — ABNORMAL LOW (ref 8.9–10.3)
GFR calc non Af Amer: >60 mL/min (ref >60)
Glucose, Bld: 125 mg/dL — ABNORMAL HIGH (ref 65–99)
Potassium: 4.1 mmol/L (ref 3.5–5.1)
SODIUM: 134 mmol/L — AB (ref 135–145)

## 2015-04-22 LAB — CBC
HCT: 24.5 % — ABNORMAL LOW (ref 36.0–46.0)
Hemoglobin: 8.4 g/dL — ABNORMAL LOW (ref 12.0–15.0)
MCH: 30.7 pg (ref 26.0–34.0)
MCHC: 34.3 g/dL (ref 30.0–36.0)
MCV: 89.4 fL (ref 78.0–100.0)
PLATELETS: 145 10*3/uL — AB (ref 150–400)
RBC: 2.74 MIL/uL — AB (ref 3.87–5.11)
RDW: 12.2 % (ref 11.5–15.5)
WBC: 5.2 10*3/uL (ref 4.0–10.5)

## 2015-04-22 MED ORDER — PROMETHAZINE HCL 25 MG PO TABS
25.0000 mg | ORAL_TABLET | Freq: Four times a day (QID) | ORAL | Status: DC | PRN
  Administered 2015-04-22: 25 mg via ORAL
  Filled 2015-04-22: qty 1

## 2015-04-22 MED ORDER — HYDROCODONE-ACETAMINOPHEN 7.5-325 MG PO TABS
1.0000 | ORAL_TABLET | ORAL | Status: DC | PRN
  Administered 2015-04-22 – 2015-04-23 (×2): 1 via ORAL
  Filled 2015-04-22 (×2): qty 1

## 2015-04-22 NOTE — Progress Notes (Signed)
04/22/15 1043  PT EVALUATION  Last PT Received On 04/22/15  Assistance Needed +1  History of Present Illness 54 [facility].o. [facility] for elective R direct anterior THA.  Pt with significant PMHx of   Restrictions  RLE Weight Bearing WBAT  Home Living  Family/patient expects to be discharged to: Private residence  Living Arrangements Spouse/significant other;Children (twin 259 y.o. girls)  Available Help at Discharge Family;Available 24 hours/day;Available PRN/intermittently (husband only has to take kids to school/events, but otherwis)  Type of Home House  Home Access Stairs to enter  Entrance Stairs-Number of Steps 3  Entrance Stairs-Rails None  Home Layout Two level;Able to live on main level with bedroom/bathroom  Scientist, physiologicalBathroom Shower/Tub Walk-in shower  Bathroom Toilet Standard  Bathroom Accessibility Yes  Home Equipment Shower seat - built in  Additional Comments was an avid runner until this hip started going.   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`  Communication  Communication No difficulties  Pain Assessment  Pain Assessment 0-10  Pain Score 5  Pain Location right hip  Pain Descriptors / Indicators Aching;Burning  Pain Intervention(s) Limited activity within patient's tolerance;Monitored during session;Premedicated before session;Repositioned  Cognition  Arousal/Alertness Awake/alert  Behavior During Therapy WFL for tasks assessed/performed  Overall Cognitive Status Within Functional Limits for tasks assessed  Upper Extremity Assessment  Upper Extremity Assessment Defer to OT evaluation  Lower Extremity Assessment  Lower Extremity Assessment RLE deficits/detail  RLE Deficits / Details right leg with normal post op pain and weakness.  Pt with at least 3/5 aknle 3-/5 knee ext, 2+/5 hip  Cervical / Trunk Assessment  Cervical / Trunk Assessment Normal  Bed Mobility  Overal bed  mobility Needs Assistance  Bed Mobility Supine to Sit  Supine to sit Min assist  General bed mobility comments Min assist to help progress right leg over EOB to get to sitting.  Verbal cues for 1/2 bridge technique.   Transfers  Overall transfer level Needs assistance  Equipment used Rolling walker (2 wheeled)  Transfers Sit to/from Stand  Sit to Stand Min assist  General transfer comment Min assist to support trunk while going to standing.  verbal cues for safe hand placement. min guard assist to sit down in chair after walk.  Verbal cues again for safe hand placement and slow descent.   Ambulation/Gait  Ambulation/Gait assistance Min guard;Supervision;+2 safety/equipment (chair to follow- did not end up needing. )  Ambulation Distance (Feet) 100 Feet  Assistive device Rolling walker (2 wheeled)  Gait Pattern/deviations Step-through pattern;Antalgic  General Gait Details mildly antalgic gait pattern with min guard assist initially for safety, pt able to get up to supervision by end of gait. Chair to follow for safety.   Gait velocity decreased  Gait velocity interpretation Below normal speed for age/gender  Balance  Overall balance assessment Needs assistance  Sitting-balance support Feet supported;No upper extremity supported  Sitting balance-Leahy Scale Good  Standing balance support Bilateral upper extremity supported  Standing balance-Leahy Scale Poor  Exercises  Exercises Total Joint  Total Joint Exercises  Ankle Circles/Pumps AROM;Both;Supine;20 reps  Quad Sets AROM;Right;10 reps;Supine  Heel Slides AAROM;Right;10 reps;Supine  Hip ABduction/ADduction AAROM;Right;10 reps;Supine  PT - End of Session  Equipment Utilized During Treatment Gait belt  Activity Tolerance Patient limited by pain  Patient left in chair;with call bell/phone within reach  Nurse Communication Mobility status;Other (comment) (mildly nauseated)  PT Assessment  PT Therapy Diagnosis  Difficulty  walking;Abnormality of gait;Acute pain;Generalized weakness  PT Recommendation/Assessment Patient needs continued PT services  PT Problem List Decreased strength;Decreased activity tolerance;Decreased balance;Decreased mobility;Decreased knowledge of use of DME;Pain  PT Plan  PT Frequency (ACUTE ONLY) 7X/week  PT Treatment/Interventions (ACUTE ONLY) DME instruction;Gait training;Stair training;Functional mobility training;Therapeutic activities;Therapeutic exercise;Balance training;Neuromuscular re-education;Patient/family education;Manual techniques;Modalities  PT Recommendation  PT equipment Rolling walker with 5" wheels  Individuals Consulted  Consulted and Agree with Results and Recommendations Patient  Acute Rehab PT Goals  Patient Stated Goal to go home tomorrow  PT Goal Formulation With patient  Time For Goal Achievement 04/29/15  Potential to Achieve Goals Good  PT Time Calculation  PT Start Time (ACUTE ONLY) 1035  PT Stop Time (ACUTE ONLY) 1107  PT Time Calculation (min) (ACUTE ONLY) 32 min  PT General Charges  $$ ACUTE PT VISIT 1 Procedure  PT Evaluation  $Initial PT Evaluation Tier I 1 Procedure  PT Treatments  $Gait Training 8-22 mins  Written Expression  Dominant Hand Right  Assessment: Pt is moving well min assist to supervision overall.  Exercise HEP initiated.  Pt will likely progress well enough to d/c home with HHPT f/u and family's assistance.   PT to follow acutely for deficits listed below.     Rollene Rotunda Nianna Igo, PT, DPT [phone]

## 2015-04-22 NOTE — Evaluation (Signed)
Occupational Therapy Evaluation and Discharge Patient Details Name: Mary Mahoney MRN: [id number] DOB: 09/06/1960 Today's Date: 11/10/[place]2016    History of Present Illness 54 y.o. [facility] for elective R direct anterior THA.      Clinical Impression   Pt was independent prior to admission. Currently, pt requires supervision for mobility with RW and min assist for LB bathing and dressing.  Educated pt in use of AE, but pt choosing to rely on her family's assist until she is able to perform on her own.  Instructed in home safety and shower transfer technique.  No further OT needs.    Follow Up Recommendations  No OT follow up    Equipment Recommendations  None recommended by OT    Recommendations for Other Services       Precautions / Restrictions Precautions Precautions: Fall Restrictions Weight Bearing Restrictions: Yes RLE Weight Bearing: Weight bearing as tolerated      Mobility Bed Mobility Overal bed mobility: Needs Assistance Bed Mobility: Supine to Sit;Sit to Supine     Supine to sit: Supervision Sit to supine: Supervision   General bed mobility comments: Supervision for cues to let L leg help R leg when getting in and OOB.    Transfers Overall transfer level: Needs assistance Equipment used: Rolling walker (2 wheeled) Transfers: Sit to/from Stand Sit to Stand: Supervision Stand pivot transfers: Supervision       General transfer comment: Verbal cues for hand placement and technique.  Verbal cues for slow descent when sitting.  Supervision for safety.      Balance Overall balance assessment: Needs assistance Sitting-balance support: Bilateral upper extremity supported;Feet supported Sitting balance-Leahy Scale: Good     Standing balance support: Bilateral upper extremity supported Standing balance-Leahy Scale: Fair                              ADL Overall ADL's : Needs assistance/impaired Eating/Feeding:  Independent;Sitting   Grooming: Supervision/safety;Wash/dry hands;Standing   Upper Body Bathing: Set up;Sitting   Lower Body Bathing: Minimal assistance;Sit to/from stand Lower Body Bathing Details (indicate cue type and reason): educated in use of long handled bath sponge Upper Body Dressing : Set up;Sitting   Lower Body Dressing: Minimal assistance;Sit to/from stand Lower Body Dressing Details (indicate cue type and reason): educated in use of reacher, sock aide and long shoe horn Toilet Transfer: Supervision/safety;RW;Ambulation;Comfort height toilet   Toileting- Clothing Manipulation and Hygiene: Supervision/safety;Sit to/from Nurse, children'sstand     Tub/Shower Transfer Details (indicate cue type and reason): verbally instructed in technique, pt plans to sit on built in seat, has hand held shower head Functional mobility during ADLs: Supervision/safety;Rolling walker General ADL Comments: Educated in safe footwear and transporting items with RW.     Vision     Perception     Praxis      Pertinent Vitals/Pain Pain Assessment: Faces Pain Score: 4  Faces Pain Scale: Hurts little more Pain Location: R hip Pain Descriptors / Indicators: Sore Pain Intervention(s): Limited activity within patient's tolerance;Monitored during session     Hand Dominance Right   Extremity/Trunk Assessment Upper Extremity Assessment Upper Extremity Assessment: Overall WFL for tasks assessed   Lower Extremity Assessment Lower Extremity Assessment: Defer to PT evaluation   Cervical / Trunk Assessment Cervical / Trunk Assessment: Normal   Communication Communication Communication: No difficulties   Cognition Arousal/Alertness: Awake/alert Behavior During Therapy: WFL for tasks assessed/performed Overall Cognitive Status: Within  Functional Limits for tasks assessed                     General Comments       Exercises Exercises: Total Joint     Shoulder Instructions      Home Living  Family/patient expects to be discharged to:: Private residence Living Arrangements: Spouse/significant other;Children Available Help at Discharge: Family;Available 24 hours/day;Available PRN/intermittently Type of Home: House Home Access: Stairs to enter [employer] of Steps: 3 Entrance Stairs-Rails: None Home Layout: Two level;Able to live on main level with bedroom/bathroom     Bathroom Shower/Tub: Producer, television/film/video: Standard     Home Equipment: Shower seat - built in   Additional Comments: was an avid runner until this hip started going.       Prior Functioning/Environment Level of Independence: Independent        Comments: works full time, in Presenter, broadcasting, mainly desk job`    OT Diagnosis: Generalized weakness;Acute pain   OT Problem List:     OT Treatment/Interventions:      OT Goals(Current goals can be found in the care plan section) Acute Rehab OT Goals Patient Stated Goal: to go home  OT Frequency:     Barriers to D/C:            Co-evaluation              End of Session Equipment Utilized During Treatment: Rolling walker;Gait belt  Activity Tolerance: Patient tolerated treatment well Patient left: in bed;with call bell/phone within reach   Time: 1505-1520 OT Time Calculation (min): 15 min Charges:  OT General Charges $OT Visit: 1 Procedure OT Evaluation $Initial OT Evaluation Tier I: 1 Procedure G-Codes:    Evern Bio 04/22/2015, 3:49 PM  [phone]

## 2015-04-22 NOTE — Progress Notes (Signed)
   Subjective:  Patient reports pain as severe overnight but well controlled now.  Objective:   VITALS:   Filed Vitals:   04/21/15 1758 04/21/15 2005 04/22/15 0100 04/22/15 0500  BP: 94/50 91/53 98/48  95/49  Pulse: 60 66 74 66  Temp:  98.7 F (37.1 C) 99.1 F (37.3 C) 98.2 F (36.8 C)  TempSrc:  Oral Oral Oral  Resp:  18 18 20   Height:      Weight:      SpO2:  98% 98% 98%    Neurologically intact ABD soft Neurovascular intact Sensation intact distally Intact pulses distally Dorsiflexion/Plantar flexion intact Incision: dressing C/D/I and no drainage No cellulitis present Compartment soft   Lab Results  Component Value Date   WBC 4.6 04/09/2015   HGB 12.6 04/09/2015   HCT 37.3 04/09/2015   MCV 89.9 04/09/2015   PLT 198 04/09/2015     Assessment/Plan:  1 Day Post-Op   - Expected postop acute blood loss anemia - will monitor for symptoms - Up with PT/OT - DVT ppx - SCDs, ambulation, asa - WBAT operative extremity - Pain control - Discharge planning  Cheral AlmasXu, Jae Skeet Michael 04/22/2015, 7:37 AM [phone]

## 2015-04-22 NOTE — Care Management Note (Addendum)
Case Management Note  Patient Details  Name: Mary Mahoney MRN: [id number] Date of Birth: 07/28/1960  Subjective/Objective:      Right Total Hip Replacement              Action/Plan:  04/22/2015, 11:07 AM NCM spoke to pt. Lives at home with Nelle DonSGO, Nathan Brown # [phone] and at home to assist with care. [facility]ided [facility] and offered [facility]. [facility]. Contacted [facility] for DME for home. Pt understands 3n1 maybe an out of pocket expense. [facility], message left for attending.   04/22/2015, 11:32 AM [facility] to [facility]. Contacted [facility] HH rep, Corrie DandyMary. [facility].    Expected Discharge Date:  04/23/2015               Expected Discharge Plan:  Home w Home Health Services  In-House Referral:     Discharge planning Services  CM Consult  Post Acute Care Choice:  Home Health Choice offered to:  Patient  DME Arranged:  Walker rolling, 3-N-1 DME Agency:  [facility]   HH Arranged:  PT HH Agency:  [facility]  Status of Service:  Completed, signed off  Medicare Important Message Given:    Date Medicare IM Given:    Medicare IM give by:    Date Additional Medicare IM Given:    Additional Medicare Important Message give by:     If discussed at Long Length of Stay Meetings, dates discussed:    Additional Comments:  Elliot CousinShavis, Danelle Curiale Ellen, RN 04/22/2015, 11:07 AM

## 2015-04-22 NOTE — Progress Notes (Signed)
Physical Therapy Treatment Patient Details Name: Mary Mahoney MRN: [id number] DOB: 04/21/1961 Today's Date: 04/22/2015    History of Present Illness 54 [facility].o. [facility] for elective R direct anterior THA.  Pt with significant PMHx of     PT Comments    The pt continues to improve with movement and was able to ambulate a further distance than last session with no c/o nausea.  She performs bed mobility and transfers almost independently and only requires minimal cues for technique and safety.  Will practice stairs tomorrow to prepare for D/C.       Equipment Recommendations  Rolling walker with 5" wheels       Precautions / Restrictions Restrictions Weight Bearing Restrictions: Yes RLE Weight Bearing: Weight bearing as tolerated    Mobility  Bed Mobility Overal bed mobility: Needs Assistance Bed Mobility: Supine to Sit;Sit to Supine     Supine to sit: Supervision Sit to supine: Supervision   General bed mobility comments: Supervision for cues to let L leg help R leg when getting in and OOB.    Transfers Overall transfer level: Needs assistance Equipment used: Rolling walker (2 wheeled) Transfers: Sit to/from [employer] CorporationStand;Stand Pivot Transfers Sit to Stand: Supervision Stand pivot transfers: Supervision       General transfer comment: Verbal cues for hand placement and technique.  Verbal cues for slow descent when sitting.  Supervision for safety.    Ambulation/Gait Ambulation/Gait assistance: Supervision Ambulation Distance (Feet): 300 Feet Assistive device: Rolling walker (2 wheeled) Gait Pattern/deviations: Antalgic;Step-through pattern     General Gait Details: supervision for safety and balance.  Pt. able to walk further distance and did not report nausea until back in bed.        Balance Overall balance assessment: Needs assistance Sitting-balance support: Bilateral upper extremity supported;Feet supported Sitting balance-Leahy Scale:  Good     Standing balance support: Bilateral upper extremity supported Standing balance-Leahy Scale: Fair                      Cognition Arousal/Alertness: Awake/alert Behavior During Therapy: WFL for tasks assessed/performed Overall Cognitive Status: Within Functional Limits for tasks assessed                      Exercises Total Joint Exercises Ankle Circles/Pumps: AROM;Both;Supine;20 reps Short Arc Quad: Strengthening;Right;10 reps;Seated Heel Slides: AROM;Right;10 reps;Supine Long Arc Quad: Strengthening;Right;10 reps;Seated Marching in Standing: AROM;Right;10 reps;Standing        Pertinent Vitals/Pain Pain Assessment: 0-10 Pain Score: 4  Pain Location: R hip Pain Descriptors / Indicators: Aching Pain Intervention(s): Limited activity within patient's tolerance;Monitored during session;Repositioned           PT Goals (current goals can now be found in the care plan section) Acute Rehab PT Goals Patient Stated Goal: to go home Progress towards PT goals: Progressing toward goals    Frequency  7X/week    PT Plan Current plan remains appropriate       End of Session Equipment Utilized During Treatment: Gait belt Activity Tolerance: Patient tolerated treatment well Patient left: in bed;with call bell/phone within reach;with SCD's reapplied     Time: [id number] PT Time Calculation (min) (ACUTE ONLY): 32 min  Charges:  1 Gait, 1 TE                   Leota JacobsenMcKinnley Rajvi Armentor, VirginiaPTA  [phone] - Office  04/22/2015, 2:49 PM

## 2015-04-22 NOTE — Progress Notes (Signed)
Utilization review completed. Coraline Talwar, RN, BSN. 

## 2015-04-22 NOTE — Progress Notes (Signed)
OT Cancellation Note  Patient Details Name: Mary L [place] MRN: [id number] DOB: 12/23/1960   Cancelled Treatment:    Reason Eval/Treat Not Completed:  Pt with nausea, just received medication.  Will continue to follow.  Evern BioMayberry, Jelisha Weed Lynn 04/22/2015, 12:39 PM

## 2015-04-23 LAB — CBC
HCT: 24.1 % — ABNORMAL LOW (ref 36.0–46.0)
Hemoglobin: 8.2 g/dL — ABNORMAL LOW (ref 12.0–15.0)
MCH: 30.1 pg (ref 26.0–34.0)
MCHC: 34 g/dL (ref 30.0–36.0)
MCV: 88.6 fL (ref 78.0–100.0)
PLATELETS: 149 10*3/uL — AB (ref 150–400)
RBC: 2.72 MIL/uL — ABNORMAL LOW (ref 3.87–5.11)
RDW: 12.2 % (ref 11.5–15.5)
WBC: 4.9 10*3/uL (ref 4.0–10.5)

## 2015-04-23 MED ORDER — GABAPENTIN 100 MG PO CAPS: ORAL_CAPSULE | ORAL | Status: DC

## 2015-04-23 NOTE — Progress Notes (Signed)
Physical Therapy Treatment Patient Details Name: Mary Mahoney [place] MRN: [id number] DOB: 03/08/1961 Today's Date: 04/23/2015    History of Present Illness 54 y.o. female [facility] for elective R direct anterior THA.       PT Comments    The pt was able to practice stairs this treatment.  Due to pain in her RLE, she decided to use the stairs at the front of her home since there are handrails there.  Due to being somewhat unsteady with stairs (especially descending), she will need assistance from family when getting in/out of the home.  She appears independent with HEP and should do well with HHPT.  Pt ready for D/C from PT standpoint.       Equipment Recommendations  Rolling walker with 5" wheels       Precautions / Restrictions Precautions Precautions: Fall Restrictions Weight Bearing Restrictions: Yes RLE Weight Bearing: Weight bearing as tolerated    Mobility  Bed Mobility Overal bed mobility: Modified Independent Bed Mobility: Supine to Sit     Supine to sit: Modified independent (Device/Increase time)     General bed mobility comments: Pt. able to use Mahoney leg to bring R to EOB.  Slight grimacing and c/o pain in RLE with movement. Pt. able to support trunk while sitting at EOB.  Use of bed rails on R and HOB moderately elevated.   Transfers Overall transfer level: Needs assistance Equipment used: Rolling walker (2 wheeled) Transfers: Sit to/from Stand Sit to Stand: Supervision         General transfer comment: Supervision for safety and balance once standing due to R quad pain.  Verbal cues for hand placement and to push off of bed or chair when standing up instead of pulling on RW.   Ambulation/Gait Ambulation/Gait assistance: Supervision Ambulation Distance (Feet): 325 Feet Assistive device: Rolling walker (2 wheeled) Gait Pattern/deviations: Step-to pattern;Antalgic Gait velocity: decreased Gait velocity interpretation: Below normal speed for  age/gender General Gait Details: Supervision for safety.  Pt. reports not being able to put as much weight on R leg as she was able to yesterday due to pain today.  cues for posture and to relax shoulders.    Stairs Stairs: Yes Stairs assistance: Min guard (supervision ascending, Min guard descending) Stair Management: One rail Left;Step to pattern;Forwards Number of Stairs: 5 (x2) General stair comments: Pt. decided to enter home through front door where there are 5 stairs with handrails on both sides.  Pt. placed both hands on Mahoney handrail when ascending/descending.  Supervision for safety when ascending, min guard for safety and control when descending due to increased pain and inability to bear much weight on RLE.        Balance Overall balance assessment: Needs assistance Sitting-balance support: Bilateral upper extremity supported;Feet supported Sitting balance-Leahy Scale: Good     Standing balance support: Bilateral upper extremity supported Standing balance-Leahy Scale: Fair                      Cognition Arousal/Alertness: Awake/alert Behavior During Therapy: WFL for tasks assessed/performed Overall Cognitive Status: Within Functional Limits for tasks assessed                      Exercises Total Joint Exercises Hip ABduction/ADduction: AROM;Right;10 reps;Standing Knee Flexion: AROM;Right;10 reps;Standing Marching in Standing: AROM;Right;10 reps;Standing Standing Hip Extension: AROM;Right;10 reps;Standing (limited ROM due to pain in quads/hip flexors)        Pertinent Vitals/Pain Pain  Assessment: 0-10 Pain Score: 4  (10+ in R quads with movement. ) Pain Location: R hip Pain Descriptors / Indicators: Sore Pain Intervention(s): Limited activity within patient's tolerance;Monitored during session;Repositioned;RN gave pain meds during session           PT Goals (current goals can now be found in the care plan section) Acute Rehab PT Goals Patient  Stated Goal: to go home Progress towards PT goals: Progressing toward goals    Frequency  7X/week    PT Plan Current plan remains appropriate       End of Session Equipment Utilized During Treatment: Gait belt Activity Tolerance: Patient limited by pain Patient left: in chair;with call bell/phone within reach     Time: 0950-1013 PT Time Calculation (min) (ACUTE ONLY): 23 min  Charges:  1 Gait, 1 TE                        Leota Jacobsen, [place]  [phone] - Office  04/23/2015, 10:29 AM

## 2015-04-23 NOTE — Discharge Instructions (Signed)
INSTRUCTIONS AFTER JOINT REPLACEMENT   o Remove items at home which could result in a fall. This includes throw rugs or furniture in walking pathways o ICE to the affected joint every three hours while awake for 30 minutes at a time, for at least the first 3-5 days, and then as needed for pain and swelling.  Continue to use ice for pain and swelling. You may notice swelling that will progress down to the foot and ankle.  This is normal after surgery.  Elevate your leg when you are not up walking on it.   o Continue to use the breathing machine you got in the hospital (incentive spirometer) which will help keep your temperature down.  It is common for your temperature to cycle up and down following surgery, especially at night when you are not up moving around and exerting yourself.  The breathing machine keeps your lungs expanded and your temperature down.   DIET:  As you were doing prior to hospitalization, we recommend a well-balanced diet.  DRESSING / WOUND CARE / SHOWERING  Keep the dressing on for 1 week then remove and place with sterile gauze and paper tape until follow up appointment  ACTIVITY  o Increase activity slowly as tolerated, but follow the weight bearing instructions below.   o No driving for 6 weeks or until further direction given by your physician.  You cannot drive while taking narcotics.  o No lifting or carrying greater than 10 lbs. until further directed by your surgeon. o Avoid periods of inactivity such as sitting longer than an hour when not asleep. This helps prevent blood clots.  o You may return to work once you are authorized by your doctor.     WEIGHT BEARING   Weight bearing as tolerated with assist device (walker, cane, etc) as directed, use it as long as suggested by your surgeon or therapist, typically at least 4-6 weeks.   EXERCISES  Results after joint replacement surgery are often greatly improved when you follow the exercise, range of motion and  muscle strengthening exercises prescribed by your doctor. Safety measures are also important to protect the joint from further injury. Any time any of these exercises cause you to have increased pain or swelling, decrease what you are doing until you are comfortable again and then slowly increase them. If you have problems or questions, call your caregiver or physical therapist for advice.   Rehabilitation is important following a joint replacement. After just a few days of immobilization, the muscles of the leg can become weakened and shrink (atrophy).  These exercises are designed to build up the tone and strength of the thigh and leg muscles and to improve motion. Often times heat used for twenty to thirty minutes before working out will loosen up your tissues and help with improving the range of motion but do not use heat for the first two weeks following surgery (sometimes heat can increase post-operative swelling).   These exercises can be done on a training (exercise) mat, on the floor, on a table or on a bed. Use whatever works the best and is most comfortable for you.    Use music or television while you are exercising so that the exercises are a pleasant break in your day. This will make your life better with the exercises acting as a break in your routine that you can look forward to.   Perform all exercises about fifteen times, three times per day or as directed.  You should exercise both the operative leg and the other leg as well.  Exercises include:    Quad Sets - Tighten up the muscle on the front of the thigh (Quad) and hold for 5-10 seconds.    Straight Leg Raises - With your knee straight (if you were given a brace, keep it on), lift the leg to 60 degrees, hold for 3 seconds, and slowly lower the leg.  Perform this exercise against resistance later as your leg gets stronger.   Leg Slides: Lying on your back, slowly slide your foot toward your buttocks, bending your knee up off the  floor (only go as far as is comfortable). Then slowly slide your foot back down until your leg is flat on the floor again.   Angel Wings: Lying on your back spread your legs to the side as far apart as you can without causing discomfort.   Hamstring Strength:  Lying on your back, push your heel against the floor with your leg straight by tightening up the muscles of your buttocks.  Repeat, but this time bend your knee to a comfortable angle, and push your heel against the floor.  You may put a pillow under the heel to make it more comfortable if necessary.   A rehabilitation program following joint replacement surgery can speed recovery and prevent re-injury in the future due to weakened muscles. Contact your doctor or a physical therapist for more information on knee rehabilitation.    CONSTIPATION  Constipation is defined medically as fewer than three stools per week and severe constipation as less than one stool per week.  Even if you have a regular bowel pattern at home, your normal regimen is likely to be disrupted due to multiple reasons following surgery.  Combination of anesthesia, postoperative narcotics, change in appetite and fluid intake all can affect your bowels.   YOU MUST use at least one of the following options; they are listed in order of increasing strength to get the job done.  They are all available over the counter, and you may need to use some, POSSIBLY even all of these options:    Drink plenty of fluids (prune juice may be helpful) and high fiber foods Colace 100 mg by mouth twice a day  Senokot for constipation as directed and as needed Dulcolax (bisacodyl), take with full glass of water  Miralax (polyethylene glycol) once or twice a day as needed.  If you have tried all these things and are unable to have a bowel movement in the first 3-4 days after surgery call either your surgeon or your primary doctor.    If you experience loose stools or diarrhea, hold the  medications until you stool forms back up.  If your symptoms do not get better within 1 week or if they get worse, check with your doctor.  If you experience "the worst abdominal pain ever" or develop nausea or vomiting, please contact the office immediately for further recommendations for treatment.   ITCHING:  If you experience itching with your medications, try taking only a single pain pill, or even half a pain pill at a time.  You can also use Benadryl over the counter for itching or also to help with sleep.   TED HOSE STOCKINGS:  Use stockings on both legs until for at least 2 weeks or as directed by physician office. They may be removed at night for sleeping.  MEDICATIONS:  See your medication summary on the After Visit Summary that  nursing will review with you.  You may have some home medications which will be placed on hold until you complete the course of blood thinner medication.  It is important for you to complete the blood thinner medication as prescribed.  PRECAUTIONS:  If you experience chest pain or shortness of breath - call 911 immediately for transfer to the hospital emergency department.   If you develop a fever greater that 101 F, purulent drainage from wound, increased redness or drainage from wound, foul odor from the wound/dressing, or calf pain - CONTACT YOUR SURGEON.                                                   FOLLOW-UP APPOINTMENTS:  If you do not already have a post-op appointment, please call the office for an appointment to be seen by your surgeon.  Guidelines for how soon to be seen are listed in your After Visit Summary, but are typically between 1-4 weeks after surgery.  OTHER INSTRUCTIONS:   Knee Replacement:  Do not place pillow under knee, focus on keeping the knee straight while resting. CPM instructions: 0-90 degrees, 2 hours in the morning, 2 hours in the afternoon, and 2 hours in the evening. Place foam block, curve side up under heel at all times  except when in CPM or when walking.  DO NOT modify, tear, cut, or change the foam block in any way.  MAKE SURE YOU:   Understand these instructions.   Get help right away if you are not doing well or get worse.    Thank you for letting us be a part of your medical care team.  It is a privilege we respect greatly.  We hope these instructions will help you stay on track for a fast and full recovery!

## 2015-04-23 NOTE — Discharge Summary (Signed)
Physician Discharge Summary      Patient ID: Mary Mahoney MRN: [id number] DOB/AGE: 53-Oct-1962 53 y.o.  Admit date: 04/21/2015 Discharge date: 04/23/2015  Admission Diagnoses:  <principal problem not specified>  Discharge Diagnoses:  Active Problems:   Osteoarthritis of right hip   Hip arthritis   History reviewed. No pertinent past medical history.  Surgeries: Procedure(s): RIGHT TOTAL HIP ARTHROPLASTY ANTERIOR APPROACH on 04/21/2015   Consultants (if any):    Discharged Condition: Improved  Hospital Course: Mary Mahoney is an 54 y.o. female who was admitted 04/21/2015 with a diagnosis of <principal problem not specified> and went to the operating room on 04/21/2015 and underwent the above named procedures.    She was given perioperative antibiotics:  Anti-infectives    Start     Dose/Rate Route Frequency Ordered Stop   04/21/15 1930  ceFAZolin (ANCEF) IVPB 2 g/50 mL premix     2 g 100 mL/hr over 30 Minutes Intravenous Every 6 hours 04/21/15 1616 04/22/15 0249   04/21/15 1000  ceFAZolin (ANCEF) IVPB 2 g/50 mL premix     2 g 100 mL/hr over 30 Minutes Intravenous To ShortStay Surgical 04/21/15 0935 04/21/15 1220   04/21/15 0943  ceFAZolin (ANCEF) 2-3 GM-% IVPB SOLR    Comments:  Kathrene Bongo   : cabinet override      04/21/15 0943 04/21/15 2159    .  She was given sequential compression devices, early ambulation, and aspirin for DVT prophylaxis.  She benefited maximally from the hospital stay and there were no complications.    Recent vital signs:  Filed Vitals:   04/23/15 0549  BP: 93/47  Pulse: 68  Temp: 99.7 F (37.6 C)  Resp: 16    Recent laboratory studies:  Lab Results  Component Value Date   HGB 8.2* 04/23/2015   HGB 8.4* 04/22/2015   HGB 12.6 04/09/2015   Lab Results  Component Value Date   WBC 4.9 04/23/2015   PLT 149* 04/23/2015   Lab Results  Component Value Date   INR 1.03 04/09/2015   Lab Results  Component Value Date   NA  134* 04/22/2015   K 4.1 04/22/2015   CL 103 04/22/2015   CO2 26 04/22/2015   BUN 17 04/22/2015   CREATININE 0.81 04/22/2015   GLUCOSE 125* 04/22/2015    Discharge Medications:     Medication List    TAKE these medications        acetaminophen 500 MG tablet  Commonly known as:  TYLENOL  Take 500-1,000 mg by mouth every 8 (eight) hours as needed for mild pain or moderate pain.     ANTIOXIDANT PO  Take 1 capsule by mouth daily.     aspirin EC 325 MG tablet  Take 1 tablet (325 mg total) by mouth 2 (two) times daily.     CALCIUM 600 + D PO  Take 1 tablet by mouth daily.     Chromium 200 MCG Caps  Take 1 capsule by mouth daily.     FISH OIL PO  Take 1 capsule by mouth daily.     gabapentin 100 MG capsule  Commonly known as:  NEURONTIN  300 mg at bedtime for the first night then 100 mg TID     multivitamin with minerals Tabs tablet  Take 3 tablets by mouth daily.     naproxen sodium 220 MG tablet  Commonly known as:  ANAPROX  Take 440 mg by mouth 2 (two) times daily.  NON FORMULARY  Take 1 capsule by mouth daily. Cellular health     oxyCODONE 10 mg 12 hr tablet  Commonly known as:  OXYCONTIN  Take 1 tablet (10 mg total) by mouth every 12 (twelve) hours.     oxyCODONE 5 MG immediate release tablet  Commonly known as:  Oxy IR/ROXICODONE  Take 1-3 tablets (5-15 mg total) by mouth every 4 (four) hours as needed.     TURMERIC PO  Take 1 tablet by mouth daily.        Diagnostic Studies: Dg Pelvis Portable  04/21/2015  CLINICAL DATA:  RIGHT total hip arthroplasty EXAM: PORTABLE PELVIS 1-2 VIEWS COMPARISON:  Portable exam 1456 hours compared to intraoperative images of 04/21/2015. FINDINGS: Components of RIGHT total hip prosthesis identified in expected positions. No acute fracture, dislocation or bone destruction. Expected regional postsurgical soft tissue changes. Visualized pelvis appears intact. IMPRESSION: RIGHT hip prosthesis without acute complication.  Electronically Signed   By: Ulyses SouthwardMark  Boles M.D.   On: 04/21/2015 15:35   Dg Hip Operative Unilat With Pelvis Right  04/21/2015  CLINICAL DATA:  Patient status post right hip arthroplasty. EXAM: OPERATIVE RIGHT HIP (WITH PELVIS IF PERFORMED) 2 VIEWS TECHNIQUE: Fluoroscopic spot image(s) were submitted for interpretation post-operatively. COMPARISON:  None. FINDINGS: Patient status post right hip arthroplasty. Two intraoperative fluoroscopic images were obtained demonstrating postsurgical hardware. No evidence for acute osseous abnormality. IMPRESSION: Patient status post right hip arthroplasty. Electronically Signed   By: Annia Beltrew  Davis M.D.   On: 04/21/2015 14:01    Disposition:       Discharge Instructions    Call MD / Call 911    Complete by:  As directed   If you experience chest pain or shortness of breath, CALL 911 and be transported to the hospital emergency room.  If you develope a fever above 101.5 F, pus (white drainage) or increased drainage or redness at the wound, or calf pain, call your surgeon's office.     Constipation Prevention    Complete by:  As directed   Drink plenty of fluids.  Prune juice may be helpful.  You may use a stool softener, such as Colace (over the counter) 100 mg twice a day.  Use MiraLax (over the counter) for constipation as needed.     Diet - low sodium heart healthy    Complete by:  As directed      Diet general    Complete by:  As directed      Driving restrictions    Complete by:  As directed   No driving while taking narcotic pain meds.     Increase activity slowly as tolerated    Complete by:  As directed            Follow-up Information    Follow up with Cheral AlmasXu, Chisa Kushner Michael, MD In 2 weeks.   Specialty:  Orthopedic Surgery   Why:  For suture removal, For wound re-check   Contact information:   [address] [place] [address] [phone]       [facility] with [facility] .   Why:  Home Health Physical Therapy   Contact  information:   [phone]       Signed: Cheral AlmasXu, Gitty Osterlund Michael 04/23/2015, 7:47 AM

## 2015-04-23 NOTE — Progress Notes (Signed)
   Subjective:  Patient reports pain as severe overnight but well controlled now.  Objective:   VITALS:   Filed Vitals:   04/22/15 1247 04/22/15 1600 04/22/15 2037 04/23/15 0549  BP: 85/51 100/48 89/47 93/47   Pulse: 59 68 63 68  Temp: 96.8 F (36 C)  99.7 F (37.6 C) 99.7 F (37.6 C)  TempSrc: Oral  Oral Oral  Resp: 18  16 16   Height:      Weight:      SpO2: 98%  97% 97%    Neurologically intact ABD soft Neurovascular intact Sensation intact distally Intact pulses distally Dorsiflexion/Plantar flexion intact Incision: dressing C/D/I and no drainage No cellulitis present Compartment soft   Lab Results  Component Value Date   WBC 4.9 04/23/2015   HGB 8.2* 04/23/2015   HCT 24.1* 04/23/2015   MCV 88.6 04/23/2015   PLT 149* 04/23/2015     Assessment/Plan:  2 Days Post-Op   - Expected postop acute blood loss anemia - will monitor for symptoms - Up with PT/OT - DVT ppx - SCDs, ambulation, asa - WBAT operative extremity - Pain control - Discharge planning - home after PT today  Cheral AlmasXu, Naiping Michael 04/23/2015, 7:44 AM [phone]

## 2015-11-19 ENCOUNTER — Other Ambulatory Visit: Payer: Self-pay | Admitting: Orthopaedic Surgery

## 2015-11-19 DIAGNOSIS — M25532 Pain in left wrist: Secondary | ICD-10-CM

## 2015-11-26 ENCOUNTER — Ambulatory Visit
Admission: RE | Admit: 2015-11-26 | Discharge: 2015-11-26 | Disposition: A | Discharge status: Home / Self Care | Payer: 59 | Source: Ambulatory Visit | Attending: Orthopaedic Surgery | Admitting: Orthopaedic Surgery

## 2015-11-26 DIAGNOSIS — M25532 Pain in left wrist: Secondary | ICD-10-CM

## 2016-01-05 DIAGNOSIS — M778 Other enthesopathies, not elsewhere classified: Secondary | ICD-10-CM | POA: Insufficient documentation

## 2016-01-05 DIAGNOSIS — G5612 Other lesions of median nerve, left upper limb: Secondary | ICD-10-CM | POA: Insufficient documentation

## 2016-01-29 ENCOUNTER — Encounter (INDEPENDENT_AMBULATORY_CARE_PROVIDER_SITE_OTHER): Payer: Self-pay

## 2016-04-17 ENCOUNTER — Ambulatory Visit (INDEPENDENT_AMBULATORY_CARE_PROVIDER_SITE_OTHER): Payer: Self-pay | Admitting: Orthopaedic Surgery

## 2016-04-18 ENCOUNTER — Ambulatory Visit (INDEPENDENT_AMBULATORY_CARE_PROVIDER_SITE_OTHER): Payer: 59

## 2016-04-18 ENCOUNTER — Ambulatory Visit (INDEPENDENT_AMBULATORY_CARE_PROVIDER_SITE_OTHER): Payer: 59 | Admitting: Orthopaedic Surgery

## 2016-04-18 ENCOUNTER — Encounter (INDEPENDENT_AMBULATORY_CARE_PROVIDER_SITE_OTHER): Payer: Self-pay | Admitting: Orthopaedic Surgery

## 2016-04-18 DIAGNOSIS — M1611 Unilateral primary osteoarthritis, right hip: Secondary | ICD-10-CM | POA: Diagnosis not present

## 2016-04-18 NOTE — Progress Notes (Signed)
   Office Visit Note   Patient: Mary Mahoney           Date of Birth: 05/01/1961           MRN: [id number] Visit Date: 04/18/2016              Requested by: Shirlean Mylararol Webb, MD [address] [place] [address] PCP: Frederich ChickWEBB, CAROL D, MD   Assessment & Plan: Visit Diagnoses:  1. Primary osteoarthritis of right hip     Plan:  - doing very well at 1 year - reminded her of dental prophylaxis - f/u 1 year for 2 year appt.  Needs ap pelvis on return  Follow-Up Instructions: Return in about 1 year (around 04/18/2017) for recheck hip.   Orders:  Orders Placed This Encounter  Procedures  . XR HIP UNILAT W OR W/O PELVIS 1V RIGHT   No orders of the defined types were placed in this encounter.     Procedures: No procedures performed   Clinical Data: No additional findings.   Subjective: No chief complaint on file.   [name] is 1 year s/p R THA.  Doing very well.  Running 5-6 miles at a time without problems.  Very happy with how she's doing.    Review of Systems   Objective: Vital Signs: LMP 07/14/2011   Physical Exam  Right Hip Exam  Right hip exam is normal.   Range of Motion  The patient has normal right hip ROM.  Muscle Strength  The patient has normal right hip strength.      Specialty Comments:  No specialty comments available.  Imaging: Xr Hip Unilat W Or W/o Pelvis 1v Right  Result Date: 04/18/2016 Stable right THA in good alignment.    PMFS History: Patient Active Problem List   Diagnosis Date Noted  . Osteoarthritis of right hip 04/21/2015  . Hip arthritis 04/21/2015   History reviewed. No pertinent past medical history.  History reviewed. No pertinent family history.  Past Surgical History:  Procedure Laterality Date  . COLONOSCOPY    . EYE SURGERY Bilateral    Lasik surgery  . JOINT REPLACEMENT Right 04/2015   by Dr. Mayra ReelN. Michael Xu  . TOTAL HIP ARTHROPLASTY Right 04/21/2015   Procedure: RIGHT TOTAL HIP  ARTHROPLASTY ANTERIOR APPROACH;  Surgeon: Tarry KosNaiping M Xu, MD;  Location: [facility] OR;  Service: Orthopedics;  Laterality: Right;   Social History   Occupational History  . Not on file.   Social History Main Topics  . Smoking status: Former Games developermoker  . Smokeless tobacco: Not on file  . Alcohol use Yes     Comment: with dinner once a week; occasional  . Drug use: No  . Sexual activity: Not on file

## 2017-04-17 ENCOUNTER — Encounter (INDEPENDENT_AMBULATORY_CARE_PROVIDER_SITE_OTHER): Payer: Self-pay | Admitting: Orthopaedic Surgery

## 2017-04-17 ENCOUNTER — Ambulatory Visit (INDEPENDENT_AMBULATORY_CARE_PROVIDER_SITE_OTHER): Payer: 59 | Admitting: Orthopaedic Surgery

## 2017-04-17 ENCOUNTER — Ambulatory Visit (INDEPENDENT_AMBULATORY_CARE_PROVIDER_SITE_OTHER): Payer: 59

## 2017-04-17 DIAGNOSIS — M1611 Unilateral primary osteoarthritis, right hip: Secondary | ICD-10-CM

## 2017-04-17 NOTE — Progress Notes (Signed)
   Office Visit Note   Patient: Mary Mahoney           Date of Birth: 06/07/1961           MRN: [id number] Visit Date: 04/17/2017              Requested by: Shirlean MylarWebb, Carol, MD [address] [place], [place] [address] PCP: Shirlean MylarWebb, Carol, MD   Assessment & Plan: Visit Diagnoses:  1. Primary osteoarthritis of right hip     Plan: Patient is doing very well from my standpoint.  Questions encouraged and answered.  Activity as tolerated.  She no longer has to take antibiotics before dental procedures.  Follow-up in 3 years for her 5-year visit.  She will need standing AP and lateral pelvis  Follow-Up Instructions: Return in about 3 years (around 04/17/2020).   Orders:  Orders Placed This Encounter  Procedures  . XR HIP UNILAT W OR W/O PELVIS 2-3 VIEWS RIGHT   No orders of the defined types were placed in this encounter.     Procedures: No procedures performed   Clinical Data: No additional findings.   Subjective: Chief Complaint  Patient presents with  . Right Hip - Pain, Follow-up    Mary Mahoney is 2 years status post right total hip replacement.  She comes in today for routine follow-up.  She is doing well and without any complaints.  She is still very active and happy with her recovery.    Review of Systems   Objective: Vital Signs: LMP 07/14/2011   Physical Exam  Ortho Exam Leg lengths are equal.  Surgical scar is fully healed. Specialty Comments:  No specialty comments available.  Imaging: Xr Hip Unilat W Or W/o Pelvis 2-3 Views Right  Result Date: 04/17/2017 Stable right total hip replacement in good alignment    PMFS History: Patient Active Problem List   Diagnosis Date Noted  . Osteoarthritis of right hip 04/21/2015  . Hip arthritis 04/21/2015   History reviewed. No pertinent past medical history.  History reviewed. No pertinent family history.  Past Surgical History:  Procedure Laterality Date  . COLONOSCOPY    . EYE SURGERY  Bilateral    Lasik surgery  . JOINT REPLACEMENT Right 04/2015   by Dr. Mayra ReelN. Michael Illya Gienger   Social History   Occupational History  . Not on file  Tobacco Use  . Smoking status: Former Games developermoker  . Smokeless tobacco: Never Used  Substance and Sexual Activity  . Alcohol use: Yes    Comment: with dinner once a week; occasional  . Drug use: No  . Sexual activity: Not on file

## 2017-08-07 ENCOUNTER — Emergency Department (HOSPITAL_COMMUNITY): Payer: 59

## 2017-08-07 ENCOUNTER — Other Ambulatory Visit: Payer: Self-pay

## 2017-08-07 ENCOUNTER — Emergency Department (HOSPITAL_COMMUNITY)
Admission: EM | Admit: 2017-08-07 | Discharge: 2017-08-07 | Disposition: A | Discharge status: Home / Self Care | Payer: 59 | Attending: Emergency Medicine | Admitting: Emergency Medicine

## 2017-08-07 ENCOUNTER — Encounter (HOSPITAL_COMMUNITY): Payer: Self-pay | Admitting: Emergency Medicine

## 2017-08-07 DIAGNOSIS — S060X1A Concussion with loss of consciousness of 30 minutes or less, initial encounter: Secondary | ICD-10-CM | POA: Insufficient documentation

## 2017-08-07 DIAGNOSIS — Y9323 Activity, snow (alpine) (downhill) skiing, snow boarding, sledding, tobogganing and snow tubing: Secondary | ICD-10-CM | POA: Diagnosis not present

## 2017-08-07 DIAGNOSIS — S43112A Subluxation of left acromioclavicular joint, initial encounter: Secondary | ICD-10-CM | POA: Diagnosis not present

## 2017-08-07 DIAGNOSIS — S43102A Unspecified dislocation of left acromioclavicular joint, initial encounter: Secondary | ICD-10-CM

## 2017-08-07 DIAGNOSIS — Z87891 Personal history of nicotine dependence: Secondary | ICD-10-CM | POA: Insufficient documentation

## 2017-08-07 DIAGNOSIS — S060X9A Concussion with loss of consciousness of unspecified duration, initial encounter: Secondary | ICD-10-CM | POA: Diagnosis not present

## 2017-08-07 DIAGNOSIS — S4992XA Unspecified injury of left shoulder and upper arm, initial encounter: Secondary | ICD-10-CM | POA: Diagnosis present

## 2017-08-07 DIAGNOSIS — M25512 Pain in left shoulder: Secondary | ICD-10-CM | POA: Diagnosis not present

## 2017-08-07 DIAGNOSIS — W51XXXA Accidental striking against or bumped into by another person, initial encounter: Secondary | ICD-10-CM | POA: Insufficient documentation

## 2017-08-07 DIAGNOSIS — Z794 Long term (current) use of insulin: Secondary | ICD-10-CM | POA: Insufficient documentation

## 2017-08-07 DIAGNOSIS — Z79899 Other long term (current) drug therapy: Secondary | ICD-10-CM | POA: Diagnosis not present

## 2017-08-07 DIAGNOSIS — Y929 Unspecified place or not applicable: Secondary | ICD-10-CM | POA: Diagnosis not present

## 2017-08-07 DIAGNOSIS — Y999 Unspecified external cause status: Secondary | ICD-10-CM | POA: Diagnosis not present

## 2017-08-07 DIAGNOSIS — R51 Headache: Secondary | ICD-10-CM | POA: Diagnosis not present

## 2017-08-07 HISTORY — DX: Unspecified osteoarthritis, unspecified site: M19.90
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
## 2017-08-07 NOTE — ED Notes (Signed)
No e-signature pad available; sling was applied and patient verbalized understanding of d/c instructions.

## 2017-08-07 NOTE — Discharge Instructions (Signed)
Take 4 over the counter ibuprofen tablets 3 times a day or 2 over-the-counter naproxen tablets twice a day for pain. Also take tylenol 1000mg(2 extra strength) four times a day.    
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
## 2017-08-09 ENCOUNTER — Ambulatory Visit (INDEPENDENT_AMBULATORY_CARE_PROVIDER_SITE_OTHER): Payer: 59 | Admitting: Orthopaedic Surgery

## 2017-08-09 ENCOUNTER — Encounter (HOSPITAL_COMMUNITY): Payer: Self-pay | Admitting: *Deleted

## 2017-08-09 ENCOUNTER — Ambulatory Visit (INDEPENDENT_AMBULATORY_CARE_PROVIDER_SITE_OTHER): Payer: 59

## 2017-08-09 ENCOUNTER — Encounter (INDEPENDENT_AMBULATORY_CARE_PROVIDER_SITE_OTHER): Payer: Self-pay | Admitting: Orthopaedic Surgery

## 2017-08-09 ENCOUNTER — Other Ambulatory Visit: Payer: Self-pay

## 2017-08-09 DIAGNOSIS — S43102A Unspecified dislocation of left acromioclavicular joint, initial encounter: Secondary | ICD-10-CM | POA: Diagnosis not present

## 2017-08-09 NOTE — Progress Notes (Signed)
Spoke with pt for pre-op call. Pt denies cardiac history, HTN or diabetes.  
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
## 2017-08-10 ENCOUNTER — Other Ambulatory Visit: Payer: Self-pay

## 2017-08-10 ENCOUNTER — Ambulatory Visit (HOSPITAL_COMMUNITY): Payer: 59 | Admitting: Certified Registered"

## 2017-08-10 ENCOUNTER — Ambulatory Visit (HOSPITAL_COMMUNITY): Payer: 59

## 2017-08-10 ENCOUNTER — Encounter (HOSPITAL_COMMUNITY): Payer: Self-pay

## 2017-08-10 ENCOUNTER — Encounter (HOSPITAL_COMMUNITY)
Admission: RE | Disposition: A | Discharge status: Home / Self Care | Payer: Self-pay | Source: Ambulatory Visit | Attending: Orthopaedic Surgery

## 2017-08-10 ENCOUNTER — Ambulatory Visit (HOSPITAL_COMMUNITY)
Admission: RE | Admit: 2017-08-10 | Discharge: 2017-08-10 | Disposition: A | Discharge status: Home / Self Care | Payer: 59 | Source: Ambulatory Visit | Attending: Orthopaedic Surgery | Admitting: Orthopaedic Surgery

## 2017-08-10 DIAGNOSIS — X58XXXA Exposure to other specified factors, initial encounter: Secondary | ICD-10-CM | POA: Diagnosis not present

## 2017-08-10 DIAGNOSIS — Z981 Arthrodesis status: Secondary | ICD-10-CM | POA: Diagnosis not present

## 2017-08-10 DIAGNOSIS — G8918 Other acute postprocedural pain: Secondary | ICD-10-CM | POA: Diagnosis not present

## 2017-08-10 DIAGNOSIS — Z96641 Presence of right artificial hip joint: Secondary | ICD-10-CM | POA: Diagnosis not present

## 2017-08-10 DIAGNOSIS — Z87891 Personal history of nicotine dependence: Secondary | ICD-10-CM | POA: Diagnosis not present

## 2017-08-10 DIAGNOSIS — Z8249 Family history of ischemic heart disease and other diseases of the circulatory system: Secondary | ICD-10-CM | POA: Diagnosis not present

## 2017-08-10 DIAGNOSIS — Z79899 Other long term (current) drug therapy: Secondary | ICD-10-CM | POA: Insufficient documentation

## 2017-08-10 DIAGNOSIS — M1611 Unilateral primary osteoarthritis, right hip: Secondary | ICD-10-CM | POA: Diagnosis not present

## 2017-08-10 DIAGNOSIS — S43102A Unspecified dislocation of left acromioclavicular joint, initial encounter: Secondary | ICD-10-CM | POA: Insufficient documentation

## 2017-08-10 DIAGNOSIS — M1990 Unspecified osteoarthritis, unspecified site: Secondary | ICD-10-CM | POA: Insufficient documentation

## 2017-08-10 DIAGNOSIS — S43109A Unspecified dislocation of unspecified acromioclavicular joint, initial encounter: Secondary | ICD-10-CM

## 2017-08-10 HISTORY — DX: Anemia, unspecified: D64.9

## 2017-08-10 HISTORY — PX: ACROMIO-CLAVICULAR JOINT REPAIR: SHX5183

## 2017-08-10 HISTORY — DX: Unspecified dislocation of unspecified acromioclavicular joint, initial encounter: S43.109A

## 2017-08-10 LAB — CBC
HCT: 39.3 % (ref 36.0–46.0)
Hemoglobin: 13.1 g/dL (ref 12.0–15.0)
MCH: 30.1 pg (ref 26.0–34.0)
MCHC: 33.3 g/dL (ref 30.0–36.0)
MCV: 90.3 fL (ref 78.0–100.0)
Platelets: 204 10*3/uL (ref 150–400)
RBC: 4.35 MIL/uL (ref 3.87–5.11)
RDW: 12.1 % (ref 11.5–15.5)
WBC: 5.7 10*3/uL (ref 4.0–10.5)

## 2017-08-10 SURGERY — REPAIR, ACROMIOCLAVICULAR JOINT
Anesthesia: General | Site: Shoulder | Laterality: Left

## 2017-08-10 MED ORDER — DEXAMETHASONE SODIUM PHOSPHATE 10 MG/ML IJ SOLN
INTRAMUSCULAR | Status: AC
  Filled 2017-08-10: qty 1

## 2017-08-10 MED ORDER — FENTANYL CITRATE (PF) 100 MCG/2ML IJ SOLN: 25.0000 ug | INTRAMUSCULAR | Status: DC | PRN

## 2017-08-10 MED ORDER — OXYCODONE-ACETAMINOPHEN 5-325 MG PO TABS: 1.0000 | ORAL_TABLET | ORAL | 0 refills | Status: DC | PRN

## 2017-08-10 MED ORDER — FENTANYL CITRATE (PF) 100 MCG/2ML IJ SOLN
INTRAMUSCULAR | Status: DC | PRN
  Administered 2017-08-10: 100 ug via INTRAVENOUS

## 2017-08-10 MED ORDER — CHLORHEXIDINE GLUCONATE 4 % EX LIQD: 60.0000 mL | Freq: Once | CUTANEOUS | Status: DC

## 2017-08-10 MED ORDER — ROCURONIUM BROMIDE 10 MG/ML (PF) SYRINGE
PREFILLED_SYRINGE | INTRAVENOUS | Status: AC
  Filled 2017-08-10: qty 5

## 2017-08-10 MED ORDER — ONDANSETRON HCL 4 MG/2ML IJ SOLN
INTRAMUSCULAR | Status: AC
  Filled 2017-08-10: qty 2

## 2017-08-10 MED ORDER — CEFAZOLIN SODIUM-DEXTROSE 2-4 GM/100ML-% IV SOLN
2.0000 g | INTRAVENOUS | Status: AC
  Administered 2017-08-10: 2 g via INTRAVENOUS
  Filled 2017-08-10: qty 100

## 2017-08-10 MED ORDER — OXYCODONE HCL 5 MG/5ML PO SOLN: 5.0000 mg | Freq: Once | ORAL | Status: DC | PRN

## 2017-08-10 MED ORDER — LIDOCAINE 2% (20 MG/ML) 5 ML SYRINGE
INTRAMUSCULAR | Status: DC | PRN
  Administered 2017-08-10: 60 mg via INTRAVENOUS

## 2017-08-10 MED ORDER — BUPIVACAINE-EPINEPHRINE (PF) 0.25% -1:200000 IJ SOLN
INTRAMUSCULAR | Status: DC | PRN
  Administered 2017-08-10: 20 mL via PERINEURAL

## 2017-08-10 MED ORDER — EPHEDRINE SULFATE-NACL 50-0.9 MG/10ML-% IV SOSY
PREFILLED_SYRINGE | INTRAVENOUS | Status: DC | PRN
  Administered 2017-08-10: 10 mg via INTRAVENOUS

## 2017-08-10 MED ORDER — MIDAZOLAM HCL 2 MG/2ML IJ SOLN
INTRAMUSCULAR | Status: AC
  Filled 2017-08-10: qty 2

## 2017-08-10 MED ORDER — OXYCODONE HCL 5 MG PO TABS: 5.0000 mg | ORAL_TABLET | Freq: Once | ORAL | Status: DC | PRN

## 2017-08-10 MED ORDER — PROPOFOL 10 MG/ML IV BOLUS
INTRAVENOUS | Status: AC
  Filled 2017-08-10: qty 20

## 2017-08-10 MED ORDER — BUPIVACAINE HCL (PF) 0.25 % IJ SOLN
INTRAMUSCULAR | Status: AC
  Filled 2017-08-10: qty 30

## 2017-08-10 MED ORDER — LACTATED RINGERS IV SOLN
INTRAVENOUS | Status: DC
  Administered 2017-08-10 (×2): via INTRAVENOUS

## 2017-08-10 MED ORDER — DEXAMETHASONE SODIUM PHOSPHATE 10 MG/ML IJ SOLN
INTRAMUSCULAR | Status: DC | PRN
  Administered 2017-08-10: 10 mg via INTRAVENOUS

## 2017-08-10 MED ORDER — EPHEDRINE 5 MG/ML INJ
INTRAVENOUS | Status: AC
  Filled 2017-08-10: qty 10

## 2017-08-10 MED ORDER — ONDANSETRON HCL 4 MG/2ML IJ SOLN: 4.0000 mg | Freq: Four times a day (QID) | INTRAMUSCULAR | Status: DC | PRN

## 2017-08-10 MED ORDER — TIZANIDINE HCL 4 MG PO TABS: 4.0000 mg | ORAL_TABLET | Freq: Four times a day (QID) | ORAL | 2 refills | Status: DC | PRN

## 2017-08-10 MED ORDER — LIDOCAINE 2% (20 MG/ML) 5 ML SYRINGE
INTRAMUSCULAR | Status: AC
  Filled 2017-08-10: qty 5

## 2017-08-10 MED ORDER — EPINEPHRINE PF 1 MG/ML IJ SOLN
INTRAMUSCULAR | Status: AC
  Filled 2017-08-10: qty 2

## 2017-08-10 MED ORDER — BUPIVACAINE-EPINEPHRINE (PF) 0.5% -1:200000 IJ SOLN
INTRAMUSCULAR | Status: DC | PRN
  Administered 2017-08-10: 30 mL via PERINEURAL

## 2017-08-10 MED ORDER — ONDANSETRON HCL 4 MG/2ML IJ SOLN
INTRAMUSCULAR | Status: DC | PRN
  Administered 2017-08-10: 4 mg via INTRAVENOUS

## 2017-08-10 MED ORDER — FENTANYL CITRATE (PF) 250 MCG/5ML IJ SOLN
INTRAMUSCULAR | Status: AC
  Filled 2017-08-10: qty 5

## 2017-08-10 MED ORDER — SENNOSIDES-DOCUSATE SODIUM 8.6-50 MG PO TABS: 1.0000 | ORAL_TABLET | Freq: Every evening | ORAL | 1 refills | Status: DC | PRN

## 2017-08-10 MED ORDER — MIDAZOLAM HCL 2 MG/2ML IJ SOLN
INTRAMUSCULAR | Status: DC | PRN
  Administered 2017-08-10: 2 mg via INTRAVENOUS

## 2017-08-10 MED ORDER — PROPOFOL 10 MG/ML IV BOLUS
INTRAVENOUS | Status: DC | PRN
  Administered 2017-08-10: 150 mg via INTRAVENOUS

## 2017-08-10 MED ORDER — PROMETHAZINE HCL 25 MG PO TABS: 25.0000 mg | ORAL_TABLET | Freq: Four times a day (QID) | ORAL | 1 refills | Status: DC | PRN

## 2017-08-10 SURGICAL SUPPLY — 36 items
BENZOIN TINCTURE PRP APPL 2/3 (GAUZE/BANDAGES/DRESSINGS) ×2 IMPLANT
CLOSURE STERI-STRIP 1/4X4 (GAUZE/BANDAGES/DRESSINGS) ×2 IMPLANT
COVER SURGICAL LIGHT HANDLE (MISCELLANEOUS) ×2 IMPLANT
DRAPE C-ARM 42X72 X-RAY (DRAPES) ×2 IMPLANT
DRAPE U-SHAPE 47X51 STRL (DRAPES) ×2 IMPLANT
DRSG TEGADERM 4X4.75 (GAUZE/BANDAGES/DRESSINGS) ×2 IMPLANT
DURAPREP 26ML APPLICATOR (WOUND CARE) ×2 IMPLANT
ELECT CAUTERY BLADE 6.4 (BLADE) ×2 IMPLANT
ELECT REM PT RETURN 9FT ADLT (ELECTROSURGICAL) ×2
ELECTRODE REM PT RTRN 9FT ADLT (ELECTROSURGICAL) ×1 IMPLANT
GAUZE SPONGE 4X4 12PLY STRL (GAUZE/BANDAGES/DRESSINGS) ×2 IMPLANT
GAUZE XEROFORM 1X8 LF (GAUZE/BANDAGES/DRESSINGS) ×2 IMPLANT
GLOVE BIOGEL PI IND STRL 7.0 (GLOVE) ×1 IMPLANT
GLOVE BIOGEL PI INDICATOR 7.0 (GLOVE) ×1
GLOVE ECLIPSE 7.0 STRL STRAW (GLOVE) ×2 IMPLANT
GLOVE SKINSENSE NS SZ7.5 (GLOVE) ×2
GLOVE SKINSENSE STRL SZ7.5 (GLOVE) ×2 IMPLANT
GOWN STRL REIN XL XLG (GOWN DISPOSABLE) ×4 IMPLANT
KIT AC JOINT DISP (KITS) ×2 IMPLANT
KIT BASIN OR (CUSTOM PROCEDURE TRAY) ×2 IMPLANT
KIT ROOM TURNOVER OR (KITS) ×2 IMPLANT
MANIFOLD NEPTUNE II (INSTRUMENTS) ×2 IMPLANT
NEEDLE HYPO 25GX1X1/2 BEV (NEEDLE) IMPLANT
NS IRRIG 1000ML POUR BTL (IV SOLUTION) ×2 IMPLANT
PACK SHOULDER (CUSTOM PROCEDURE TRAY) ×2 IMPLANT
PAD ARMBOARD 7.5X6 YLW CONV (MISCELLANEOUS) ×4 IMPLANT
SLING ARM IMMOBILIZER MED (SOFTGOODS) ×2 IMPLANT
STRIP CLOSURE SKIN 1/2X4 (GAUZE/BANDAGES/DRESSINGS) ×2 IMPLANT
SUCTION FRAZIER HANDLE 10FR (MISCELLANEOUS) ×1
SUCTION TUBE FRAZIER 10FR DISP (MISCELLANEOUS) ×1 IMPLANT
SUT MNCRL AB 4-0 PS2 18 (SUTURE) ×2 IMPLANT
SUT VIC AB 2-0 CT1 27 (SUTURE) ×1
SUT VIC AB 2-0 CT1 TAPERPNT 27 (SUTURE) ×1 IMPLANT
SYR CONTROL 10ML LL (SYRINGE) IMPLANT
UNDERPAD 30X30 (UNDERPADS AND DIAPERS) ×2 IMPLANT
ZIPLOOP AC JOINT REPAIR (Orthopedic Implant) ×2 IMPLANT

## 2017-08-10 NOTE — Anesthesia Procedure Notes (Signed)
Procedure Name: LMA Insertion Date/Time: 08/10/2017 2:54 PM Performed by: De Nurseennie, Svara Twyman E, CRNA Pre-anesthesia Checklist: Patient identified, Emergency Drugs available, Suction available and Patient being monitored Patient Re-evaluated:Patient Re-evaluated prior to induction Oxygen Delivery Method: Circle System Utilized Preoxygenation: Pre-oxygenation with 100% oxygen Induction Type: IV induction Ventilation: Mask ventilation without difficulty LMA: LMA inserted LMA Size: 4.0 Number of attempts: 1 Placement Confirmation: positive ETCO2 Tube secured with: Tape Dental Injury: Teeth and Oropharynx as per pre-operative assessment

## 2017-08-10 NOTE — Anesthesia Procedure Notes (Signed)
Anesthesia Regional Block: Interscalene brachial plexus block   Pre-Anesthetic Checklist: ,, timeout performed, Correct Patient, Correct Site, Correct Laterality, Correct Procedure, Correct Position, site marked, Risks and benefits discussed,  Surgical consent,  Pre-op evaluation,  At surgeon's request and post-op pain management  Laterality: Left  Prep: chloraprep       Needles:  Injection technique: Single-shot  Needle Type: Echogenic Stimulator Needle     Needle Length: 5cm  Needle Gauge: 22     Additional Needles:   Procedures:, nerve stimulator,,,,,,,   Nerve Stimulator or Paresthesia:  Response: biceps flexion, 0.45 mA,   Additional Responses:   Narrative:  Start time: 08/10/2017 2:37 PM End time: 08/10/2017 2:44 PM Injection made incrementally with aspirations every 5 mL.  Performed by: Personally  Anesthesiologist: Achille RichHodierne, Guila Owensby, MD  Additional Notes: Functioning IV was confirmed and monitors were applied.  A 50mm 22ga Arrow echogenic stimulator needle was used. Sterile prep and drape,hand hygiene and sterile gloves were used.  Negative aspiration and negative test dose prior to incremental administration of local anesthetic. The patient tolerated the procedure well.  Ultrasound guidance: relevent anatomy identified, needle position confirmed, local anesthetic spread visualized around nerve(s), vascular puncture avoided.  Image printed for medical record.

## 2017-08-10 NOTE — Anesthesia Preprocedure Evaluation (Addendum)
Anesthesia Evaluation  Patient identified by MRN, date of birth, ID band Patient awake    Reviewed: Allergy & Precautions, H&P , NPO status , Patient's Chart, lab work & pertinent test results  Airway Mallampati: II   Neck ROM: full    Dental  (+) Teeth Intact, Dental Advisory Given   Pulmonary former smoker,    breath sounds clear to auscultation       Cardiovascular negative cardio ROS   Rhythm:regular Rate:Normal     Neuro/Psych    GI/Hepatic   Endo/Other    Renal/GU      Musculoskeletal  (+) Arthritis ,   Abdominal   Peds  Hematology   Anesthesia Other Findings   Reproductive/Obstetrics                            Anesthesia Physical Anesthesia Plan  ASA: II  Anesthesia Plan: General   Post-op Pain Management:  Regional for Post-op pain   Induction: Intravenous  PONV Risk Score and Plan: 3 and Ondansetron, Dexamethasone, Midazolam and Treatment may vary due to age or medical condition  Airway Management Planned: Oral ETT  Additional Equipment:   Intra-op Plan:   Post-operative Plan: Extubation in OR  Informed Consent: I have reviewed the patients History and Physical, chart, labs and discussed the procedure including the risks, benefits and alternatives for the proposed anesthesia with the patient or authorized representative who has indicated his/her understanding and acceptance.     Plan Discussed with: CRNA, Anesthesiologist and Surgeon  Anesthesia Plan Comments:         Anesthesia Quick Evaluation

## 2017-08-10 NOTE — Transfer of Care (Signed)
Immediate Anesthesia Transfer of Care Note  Patient: Mary L [place]  Procedure(s) Performed: LEFT ACROMIO-CLAVICULAR JOINT REPAIR (Left Shoulder)  Patient Location: PACU  Anesthesia Type:General  Level of Consciousness: awake, alert  and oriented  Airway & Oxygen Therapy: Patient Spontanous Breathing and Patient connected to nasal cannula oxygen  Post-op Assessment: Report given to RN  Post vital signs: Reviewed and stable  Last Vitals:  Vitals:   08/10/17 1440 08/10/17 1642  BP:    Pulse: (!) 56 (P) 86  Resp: (!) 9 (P) 14  Temp:  (!) (P) 36.3 C  SpO2: 97% (P) 100%    Last Pain:  Vitals:   08/10/17 1341  TempSrc:   PainSc: 0-No pain      Patients Stated Pain Goal: 3 (08/10/17 1341)  Complications: No apparent anesthesia complications

## 2017-08-10 NOTE — H&P (Signed)
PREOPERATIVE H&P  Chief Complaint: left acromioclavicular joint separation  HPI: Mary Mahoney is a 57 y.o. female who presents for surgical treatment of left acromioclavicular joint separation.  She denies any changes in medical history.  Past Medical History:  Diagnosis Date  . Anemia    as a younger woman  . Osteoarthritis   . Osteoarthritis    Past Surgical History:  Procedure Laterality Date  . COLONOSCOPY    . EYE SURGERY Bilateral    Lasik surgery  . JOINT REPLACEMENT Right 04/2015   by Dr. Mayra ReelN. Michael Xu  . TOTAL HIP ARTHROPLASTY Right 04/21/2015   Procedure: RIGHT TOTAL HIP ARTHROPLASTY ANTERIOR APPROACH;  Surgeon: Tarry KosNaiping M Xu, MD;  Location: [facility];  Service: Orthopedics;  Laterality: Right;   Social History   Socioeconomic History  . Marital status: Media plannerDomestic Partner    Spouse name: None  . Number of children: None  . Years of education: None  . Highest education level: None  Social Needs  . Financial resource strain: None  . Food insecurity - worry: None  . Food insecurity - inability: None  . Transportation needs - medical: None  . Transportation needs - non-medical: None  Occupational History  . None  Tobacco Use  . Smoking status: Former Games developermoker  . Smokeless tobacco: Never Used  Substance and Sexual Activity  . Alcohol use: Yes    Comment: rare  . Drug use: No  . Sexual activity: None  Other Topics Concern  . None  Social History Narrative  . None   Family History  Problem Relation Age of Onset  . Lung disease Mother   . Heart attack Mother   . Heart disease Mother   . Diabetes Mother   . Peripheral Artery Disease Father    No Known Allergies Prior to Admission medications   Medication Sig Start Date End Date Taking? Authorizing Provider  acetaminophen (TYLENOL) 500 MG tablet Take 500-1,000 mg by mouth every 8 (eight) hours as needed for mild pain or moderate pain.   Yes [provider]  Ascorbic Acid (VITAMIN C) 1000 MG  tablet Take 1,000 mg by mouth daily.   Yes [provider]  Calcium Carb-Cholecalciferol (CALCIUM 600 + D PO) Take 1 tablet by mouth daily.   Yes [provider]  Chromium 200 MCG CAPS Take 1 capsule by mouth daily.   Yes [provider]  Multiple Vitamin (MULTIVITAMIN WITH MINERALS) TABS tablet Take 3 tablets by mouth daily.   Yes [provider]  Multiple Vitamins-Minerals (ANTIOXIDANT PO) Take 1 capsule by mouth daily.   Yes [provider]  naproxen sodium (ANAPROX) 220 MG tablet Take 440 mg by mouth 2 (two) times daily as needed.    Yes [provider]  NON FORMULARY Take 1 capsule by mouth daily. Cellular health   Yes [provider]  Omega-3 Fatty Acids (FISH OIL PO) Take 1 capsule by mouth daily.   Yes [provider]  TURMERIC PO Take 1 tablet by mouth daily.   Yes [provider]     Positive ROS: All other systems have been reviewed and were otherwise negative with the exception of those mentioned in the HPI and as above.  Physical Exam: General: Alert, no acute distress Cardiovascular: No pedal edema Respiratory: No cyanosis, no use of accessory musculature GI: abdomen soft Skin: No lesions in the area of chief complaint Neurologic: Sensation intact distally Psychiatric: Patient is competent for consent with normal  mood and affect Lymphatic: no lymphedema  MUSCULOSKELETAL: exam stable  Assessment: left acromioclavicular joint separation  Plan: Plan for Procedure(s): LEFT ACROMIO-CLAVICULAR JOINT REPAIR  The risks benefits and alternatives were discussed with the patient including but not limited to the risks of nonoperative treatment, versus surgical intervention including infection, bleeding, nerve injury,  blood clots, cardiopulmonary complications, morbidity, mortality, among others, and they were willing to proceed.   Glee Arvin, MD   08/10/2017 8:11 AM

## 2017-08-10 NOTE — Op Note (Signed)
   Date of Surgery: 08/10/2017  INDICATIONS: Ms. Mary Mahoney is a 57 y.o.-year-old female with a left type 5 AC separation;  The patient did consent to the procedure after discussion of the risks and benefits.  PREOPERATIVE DIAGNOSIS: Left type 5 AC separation  POSTOPERATIVE DIAGNOSIS: Same.  PROCEDURE: Open repair of left AC separation  SURGEON: N. Glee ArvinMichael Kairos Panetta, M.D.  ASSIST: Starlyn SkeansMary Lindsey Browntown[place]bery, [place]; necessary for the timely completion of procedure and due to complexity of procedure.  ANESTHESIA:  General and regional  IV FLUIDS AND URINE: See anesthesia.  ESTIMATED BLOOD LOSS: Minimal mL.  IMPLANTS: Biomet zip tight  DRAINS: None  COMPLICATIONS: None.  DESCRIPTION OF PROCEDURE: The patient was brought to the operating room and placed supine on the operating table.  The patient had been signed prior to the procedure and this was documented. The patient had the anesthesia placed by the anesthesiologist.  A time-out was performed to confirm that this was the correct patient, site, side and location. The patient did receive antibiotics prior to the incision and was re-dosed during the procedure as needed at indicated intervals.  The patient had the operative extremity prepped and draped in the standard surgical fashion.    I made a longitudinal incision starting from the distal clavicle towards the coracoid.  Blunt dissection was performed down to the clavipectoral fascia.  This was then incised in line with the incision.  The distal clavicle was then first exposed.  Entrapped joint capsule was removed from the [facility] joint.  The cartilage itself was undamaged.  There was no evidence of fracture.  I then continued my dissection through the muscle belly of the pectoralis major down onto the coracoid.  The soft tissues were cleared.  Retractors were placed for exposure.  Using fluoroscopic guidance and direct visualization I then drilled bicortically superior to inferior taking care not to plunge.  A  reamer was then drilled over the pin.  I then drilled a bicortical hole through the distal clavicle approximately a centimeter medial to the distal clavicle in a similar fashion.  Reamer was also used.  I then passed the foot button through the clavicle and then through the coracoid using the provided instrumentation.  The button was then flipped and confirmed under fluoroscopy.  A dog bone was then attached to the suture loops.  The suture was then slowly advanced and tightened down in order to reduce the [facility] joint.  Fluoroscopy was used to confirm proper reduction of the [facility] joint.  We then stressed the [facility] joint by pulling down on the arm and this demonstrated no displacement.  The sutures were then tied down.  Final x-rays were taken.  The wound was then thoroughly irrigated.  [facility] joint capsule was repaired.  The subcutaneous layer was closed with 2-0 Vicryl and the skin was closed with 3-0 Monocryl.  Sterile dressings were applied.  Patient tolerated procedure well had no immediate complications.  Patient was placed in a shoulder sling.  POSTOPERATIVE PLAN: Nonweightbearing in a shoulder sling.  Follow-up in 1 week.  Mayra ReelN. Michael Sargun Rummell, MD [facility] [phone] 4:21 PM

## 2017-08-10 NOTE — Discharge Instructions (Signed)
Postoperative instructions:  Weightbearing instructions: non weight bearing; wear sling at all times  Dressing instructions: Keep your dressing and/or splint clean and dry at all times.  It will be removed at your first post-operative appointment.  Your stitches and/or staples will be removed at this visit.  Incision instructions:  Do not soak your incision for 3 weeks after surgery.  If the incision gets wet, pat dry and do not scrub the incision.  Pain control:  You have been given a prescription to be taken as directed for post-operative pain control.  In addition, elevate the operative extremity above the heart at all times to prevent swelling and throbbing pain.  Take over-the-counter Colace, 100mg  by mouth twice a day while taking narcotic pain medications to help prevent constipation.  Follow up appointments: 1) 7 days for wound check.   -------------------------------------------------------------------------------------------------------------  After Surgery Pain Control:  After your surgery, post-surgical discomfort or pain is likely. This discomfort can last several days to a few weeks. At certain times of the day your discomfort may be more intense.  Did you receive a nerve block?  A nerve block can provide pain relief for one hour to two days after your surgery. As long as the nerve block is working, you will experience little or no sensation in the area the surgeon operated on.  As the nerve block wears off, you will begin to experience pain or discomfort. It is very important that you begin taking your prescribed pain medication before the nerve block fully wears off. Treating your pain at the first sign of the block wearing off will ensure your pain is better controlled and more tolerable when full-sensation returns. Do not wait until the pain is intolerable, as the medicine will be less effective. It is better to treat pain in advance than to try and catch up.  General  Anesthesia:  If you did not receive a nerve block during your surgery, you will need to start taking your pain medication shortly after your surgery and should continue to do so as prescribed by your surgeon.  Pain Medication:  Most commonly we prescribe Vicodin and Percocet for post-operative pain. Both of these medications contain a combination of acetaminophen (Tylenol) and a narcotic to help control pain.   It takes between 30 and 45 minutes before pain medication starts to work. It is important to take your medication before your pain level gets too intense.   Nausea is a common side effect of many pain medications. You will want to eat something before taking your pain medicine to help prevent nausea.   If you are taking a prescription pain medication that contains acetaminophen, we recommend that you do not take additional over the counter acetaminophen (Tylenol).  Other pain relieving options:   Using a cold pack to ice the affected area a few times a day (15 to 20 minutes at a time) can help to relieve pain, reduce swelling and bruising.   Elevation of the affected area can also help to reduce pain and swelling.

## 2017-08-12 DIAGNOSIS — S43102A Unspecified dislocation of left acromioclavicular joint, initial encounter: Secondary | ICD-10-CM

## 2017-08-13 ENCOUNTER — Encounter (HOSPITAL_COMMUNITY): Payer: Self-pay | Admitting: Orthopaedic Surgery

## 2017-08-13 ENCOUNTER — Ambulatory Visit (INDEPENDENT_AMBULATORY_CARE_PROVIDER_SITE_OTHER): Payer: 59 | Admitting: Orthopaedic Surgery

## 2017-08-15 NOTE — Anesthesia Postprocedure Evaluation (Signed)
Anesthesia Post Note  Patient: Jazzie L [place]  Procedure(s) Performed: LEFT ACROMIO-CLAVICULAR JOINT REPAIR (Left Shoulder)     Patient location during evaluation: PACU Anesthesia Type: General Level of consciousness: awake and alert Pain management: pain level controlled Vital Signs Assessment: post-procedure vital signs reviewed and stable Respiratory status: spontaneous breathing, nonlabored ventilation, respiratory function stable and patient connected to nasal cannula oxygen Cardiovascular status: blood pressure returned to baseline and stable Postop Assessment: no apparent nausea or vomiting Anesthetic complications: no    Last Vitals:  Vitals:   08/10/17 1758 08/10/17 1800  BP:  133/75  Pulse: 62 63  Resp: 18 15  Temp: (!) 36.2 C   SpO2:  99%    Last Pain:  Vitals:   08/10/17 1642  TempSrc:   PainSc: 0-No pain                 Isabella Roemmich S

## 2017-08-17 ENCOUNTER — Encounter (INDEPENDENT_AMBULATORY_CARE_PROVIDER_SITE_OTHER): Payer: Self-pay | Admitting: Orthopaedic Surgery

## 2017-08-17 ENCOUNTER — Ambulatory Visit (INDEPENDENT_AMBULATORY_CARE_PROVIDER_SITE_OTHER): Payer: 59

## 2017-08-17 ENCOUNTER — Ambulatory Visit (INDEPENDENT_AMBULATORY_CARE_PROVIDER_SITE_OTHER): Payer: 59 | Admitting: Orthopaedic Surgery

## 2017-08-17 DIAGNOSIS — Z9889 Other specified postprocedural states: Secondary | ICD-10-CM | POA: Diagnosis not present

## 2017-08-17 NOTE — Progress Notes (Signed)
   Post-Op Visit Note   Patient: Mary Mahoney           Date of Birth: 01/18/1961           MRN: [id number] Visit Date: 08/17/2017 PCP: Helane RimaWallace, Erica, DO   Assessment & Plan:  Chief Complaint:  Chief Complaint  Patient presents with  . Left Shoulder - Pain, Follow-up   Visit Diagnoses:  1. [facility]     Plan: Mary Mahoney is a pleasant 57 year old female presents to our clinic today 7 days status post left AC joint repair, date of surgery 08/10/2017.  She has been in her sling nonweightbearing.  Doing well.  Minimal pain.  No fevers chills or any other systemic symptoms.  Examination of her left upper extremity reveals a well healing surgical incision without evidence of infection or cellulitis.  She is neurovascular intact distally.  At this point, we will keep her in her sling nonweightbearing for another 2 weeks.  She will follow-up with us in 2 weeks time.  Follow-Up Instructions: Return in about 2 weeks (around 08/31/2017).   Orders:  Orders Placed This Encounter  Procedures  . XR Shoulder Left   No orders of the defined types were placed in this encounter.   Imaging: Xr Shoulder Left  Result Date: 08/17/2017 X-rays reveal stable alignment of the repair.   PMFS History: Patient Active Problem List   Diagnosis Date Noted  . [facility] 08/17/2017  . AC separation, left, initial encounter 08/12/2017  . Osteoarthritis of right hip 04/21/2015  . Hip arthritis 04/21/2015   Past Medical History:  Diagnosis Date  . Anemia    as a younger woman  . Osteoarthritis   . Osteoarthritis   . Separation of AC joint    Left    Family History  Problem Relation Age of Onset  . Lung disease Mother   . Heart attack Mother   . Heart disease Mother   . Diabetes Mother   . Peripheral Artery Disease Father     Past Surgical History:  Procedure Laterality Date  . ACROMIO-CLAVICULAR JOINT REPAIR Left 08/10/2017   Procedure: LEFT  ACROMIO-CLAVICULAR JOINT REPAIR;  Surgeon: Tarry KosXu, Naiping M, MD;  Location: [facility] OR;  Service: Orthopedics;  Laterality: Left;  . COLONOSCOPY    . EYE SURGERY Bilateral    Lasik surgery  . JOINT REPLACEMENT Right 04/2015   by Dr. Mayra ReelN. Michael Xu  . TOTAL HIP ARTHROPLASTY Right 04/21/2015   Procedure: RIGHT TOTAL HIP ARTHROPLASTY ANTERIOR APPROACH;  Surgeon: Tarry KosNaiping M Xu, MD;  Location: [facility] OR;  Service: Orthopedics;  Laterality: Right;   Social History   Occupational History  . Not on file  Tobacco Use  . Smoking status: Former Games developermoker  . Smokeless tobacco: Never Used  Substance and Sexual Activity  . Alcohol use: Yes    Comment: rare  . Drug use: No  . Sexual activity: Not on file

## 2017-08-21 ENCOUNTER — Ambulatory Visit (INDEPENDENT_AMBULATORY_CARE_PROVIDER_SITE_OTHER): Payer: 59 | Admitting: Orthopaedic Surgery

## 2017-08-31 ENCOUNTER — Ambulatory Visit (INDEPENDENT_AMBULATORY_CARE_PROVIDER_SITE_OTHER): Payer: 59 | Admitting: Orthopaedic Surgery

## 2017-08-31 ENCOUNTER — Encounter (INDEPENDENT_AMBULATORY_CARE_PROVIDER_SITE_OTHER): Payer: Self-pay | Admitting: Orthopaedic Surgery

## 2017-08-31 DIAGNOSIS — S43102A Unspecified dislocation of left acromioclavicular joint, initial encounter: Secondary | ICD-10-CM

## 2017-08-31 NOTE — Progress Notes (Signed)
Patient is 3 weeks status post acute left AC joint repair.  She is doing well.  Denies any numbness and tingling.  Denies any pain.  She mainly has some soreness.  Surgical scar is healed without any signs of infection.  Her range of motion is appropriate.  Neurovascular intact.  At this point she may begin pendulum exercises and nonweightbearing range of motion of the level of the shoulder.  Follow-up in 3 weeks with 2 view x-rays of the left clavicle.  Anticipate beginning physical therapy and strengthening at that time.

## 2017-09-14 ENCOUNTER — Encounter: Payer: Self-pay | Admitting: Physical Therapy

## 2017-09-21 ENCOUNTER — Ambulatory Visit (INDEPENDENT_AMBULATORY_CARE_PROVIDER_SITE_OTHER): Payer: 59

## 2017-09-21 ENCOUNTER — Ambulatory Visit (INDEPENDENT_AMBULATORY_CARE_PROVIDER_SITE_OTHER): Payer: 59 | Admitting: Orthopaedic Surgery

## 2017-09-21 ENCOUNTER — Encounter (INDEPENDENT_AMBULATORY_CARE_PROVIDER_SITE_OTHER): Payer: Self-pay | Admitting: Orthopaedic Surgery

## 2017-09-21 VITALS — Ht 60.0 in | Wt 125.0 lb

## 2017-09-21 DIAGNOSIS — Z9889 Other specified postprocedural states: Secondary | ICD-10-CM

## 2017-09-21 NOTE — Progress Notes (Signed)
   Post-Op Visit Note   Patient: Mary Mahoney           Date of Birth: 01/05/1961           MRN: [id number] Visit Date: 09/21/2017 PCP: Helane RimaWallace, Erica, DO   Assessment & Plan:  Chief Complaint:  Chief Complaint  Patient presents with  . Left Shoulder - Routine Post Op    08/17/17 left AC joint recon   Visit Diagnoses:  1. [facility]     Plan: Patient is a pleasant 57 year old female presents our clinic today 42 days status post left AC joint reconstruction, date of surgery 08/10/2017.  She has been doing well.  Minimal pain.  She has been working on range of motion exercises to the shoulder level.  Doing well with that.  Examination of the left upper extremity and AC joint reveals minimal tenderness over the [facility] joint.  She has full painless range of motion at the level of the shoulder.  She is neurovascularly intact distally.  At this point, we will start her in formal physical therapy to work on progressing with range of motion as well as strengthening exercises.  A prescription was given to the patient today for this.  She will follow-up with us in 6 weeks time for repeat evaluation and x-ray.  She will call with concerns or questions in the meantime.  Follow-Up Instructions: Return in about 6 weeks (around 11/02/2017).   Orders:  Orders Placed This Encounter  Procedures  . XR Clavicle Left   No orders of the defined types were placed in this encounter.   Imaging: Xr Clavicle Left  Result Date: 09/21/2017 X-rays show stable alignment of the [facility].   PMFS History: Patient Active Problem List   Diagnosis Date Noted  . [facility] 08/17/2017  . AC separation, left, initial encounter 08/12/2017  . Osteoarthritis of right hip 04/21/2015  . Hip arthritis 04/21/2015   Past Medical History:  Diagnosis Date  . Anemia    as a younger woman  . Osteoarthritis   . Osteoarthritis   . Separation of AC joint    Left    Family  History  Problem Relation Age of Onset  . Lung disease Mother   . Heart attack Mother   . Heart disease Mother   . Diabetes Mother   . Peripheral Artery Disease Father     Past Surgical History:  Procedure Laterality Date  . ACROMIO-CLAVICULAR JOINT REPAIR Left 08/10/2017   Procedure: LEFT ACROMIO-CLAVICULAR JOINT REPAIR;  Surgeon: Tarry KosXu, Naiping M, MD;  Location: [facility];  Service: Orthopedics;  Laterality: Left;  . COLONOSCOPY    . EYE SURGERY Bilateral    Lasik surgery  . JOINT REPLACEMENT Right 04/2015   by Dr. Mayra ReelN. Michael Xu  . TOTAL HIP ARTHROPLASTY Right 04/21/2015   Procedure: RIGHT TOTAL HIP ARTHROPLASTY ANTERIOR APPROACH;  Surgeon: Tarry KosNaiping M Xu, MD;  Location: [facility];  Service: Orthopedics;  Laterality: Right;   Social History   Occupational History  . Not on file  Tobacco Use  . Smoking status: Former Games developermoker  . Smokeless tobacco: Never Used  Substance and Sexual Activity  . Alcohol use: Yes    Comment: rare  . Drug use: No  . Sexual activity: Not on file

## 2017-09-26 DIAGNOSIS — M25612 Stiffness of left shoulder, not elsewhere classified: Secondary | ICD-10-CM | POA: Diagnosis not present

## 2017-09-26 DIAGNOSIS — M25512 Pain in left shoulder: Secondary | ICD-10-CM | POA: Diagnosis not present

## 2017-09-28 DIAGNOSIS — M25612 Stiffness of left shoulder, not elsewhere classified: Secondary | ICD-10-CM | POA: Diagnosis not present

## 2017-09-28 DIAGNOSIS — M25512 Pain in left shoulder: Secondary | ICD-10-CM | POA: Diagnosis not present

## 2017-09-28 DIAGNOSIS — M629 Disorder of muscle, unspecified: Secondary | ICD-10-CM | POA: Diagnosis not present

## 2017-10-03 DIAGNOSIS — M629 Disorder of muscle, unspecified: Secondary | ICD-10-CM | POA: Diagnosis not present

## 2017-10-03 DIAGNOSIS — M25512 Pain in left shoulder: Secondary | ICD-10-CM | POA: Diagnosis not present

## 2017-10-03 DIAGNOSIS — M25612 Stiffness of left shoulder, not elsewhere classified: Secondary | ICD-10-CM | POA: Diagnosis not present

## 2017-10-05 DIAGNOSIS — M25612 Stiffness of left shoulder, not elsewhere classified: Secondary | ICD-10-CM | POA: Diagnosis not present

## 2017-10-05 DIAGNOSIS — M629 Disorder of muscle, unspecified: Secondary | ICD-10-CM | POA: Diagnosis not present

## 2017-10-05 DIAGNOSIS — M25512 Pain in left shoulder: Secondary | ICD-10-CM | POA: Diagnosis not present

## 2017-10-09 ENCOUNTER — Ambulatory Visit (INDEPENDENT_AMBULATORY_CARE_PROVIDER_SITE_OTHER): Payer: 59 | Admitting: Family Medicine

## 2017-10-09 ENCOUNTER — Encounter: Payer: Self-pay | Admitting: Family Medicine

## 2017-10-09 ENCOUNTER — Other Ambulatory Visit (HOSPITAL_COMMUNITY)
Admission: RE | Admit: 2017-10-09 | Discharge: 2017-10-09 | Disposition: A | Discharge status: Home / Self Care | Payer: 59 | Source: Ambulatory Visit | Attending: Family Medicine | Admitting: Family Medicine

## 2017-10-09 VITALS — BP 110/66 | HR 56 | Temp 98.1°F | Ht 60.0 in | Wt 128.6 lb

## 2017-10-09 DIAGNOSIS — Z114 Encounter for screening for human immunodeficiency virus [HIV]: Secondary | ICD-10-CM | POA: Diagnosis not present

## 2017-10-09 DIAGNOSIS — Z124 Encounter for screening for malignant neoplasm of cervix: Secondary | ICD-10-CM | POA: Insufficient documentation

## 2017-10-09 DIAGNOSIS — Z9189 Other specified personal risk factors, not elsewhere classified: Secondary | ICD-10-CM | POA: Diagnosis not present

## 2017-10-09 DIAGNOSIS — Z1159 Encounter for screening for other viral diseases: Secondary | ICD-10-CM

## 2017-10-09 DIAGNOSIS — Z0000 Encounter for general adult medical examination without abnormal findings: Secondary | ICD-10-CM | POA: Diagnosis not present

## 2017-10-09 DIAGNOSIS — Z13220 Encounter for screening for lipoid disorders: Secondary | ICD-10-CM | POA: Diagnosis not present

## 2017-10-09 DIAGNOSIS — N898 Other specified noninflammatory disorders of vagina: Secondary | ICD-10-CM | POA: Diagnosis not present

## 2017-10-09 DIAGNOSIS — N952 Postmenopausal atrophic vaginitis: Secondary | ICD-10-CM

## 2017-10-09 LAB — COMPREHENSIVE METABOLIC PANEL
ALT: 17 U/L (ref 0–35)
AST: 19 U/L (ref 0–37)
Albumin: 4.5 g/dL (ref 3.5–5.2)
Alkaline Phosphatase: 63 U/L (ref 39–117)
BUN: 20 mg/dL (ref 6–23)
CO2: 32 mEq/L (ref 19–32)
Calcium: 9.5 mg/dL (ref 8.4–10.5)
Chloride: 105 mEq/L (ref 96–112)
Creatinine, Ser: 0.92 mg/dL (ref 0.40–1.20)
GFR: 67.08 mL/min (ref >60.00)
Glucose, Bld: 84 mg/dL (ref 70–99)
Potassium: 4.1 mEq/L (ref 3.5–5.1)
Sodium: 142 mEq/L (ref 135–145)
Total Bilirubin: 0.7 mg/dL (ref 0.2–1.2)
Total Protein: 6.3 g/dL (ref 6.0–8.3)

## 2017-10-09 LAB — CBC WITH DIFFERENTIAL/PLATELET
Basophils Absolute: 0 10*3/uL (ref 0.0–0.1)
Basophils Relative: 0.9 % (ref 0.0–3.0)
Eosinophils Absolute: 0.1 10*3/uL (ref 0.0–0.7)
Eosinophils Relative: 1.6 % (ref 0.0–5.0)
HCT: 40.1 % (ref 36.0–46.0)
Hemoglobin: 13.5 g/dL (ref 12.0–15.0)
Lymphocytes Relative: 44.4 % (ref 12.0–46.0)
Lymphs Abs: 2.1 10*3/uL (ref 0.7–4.0)
MCHC: 33.6 g/dL (ref 30.0–36.0)
MCV: 90.6 fl (ref 78.0–100.0)
Monocytes Absolute: 0.4 10*3/uL (ref 0.1–1.0)
Monocytes Relative: 7.6 % (ref 3.0–12.0)
Neutro Abs: 2.2 10*3/uL (ref 1.4–7.7)
Neutrophils Relative %: 45.5 % (ref 43.0–77.0)
Platelets: 231 10*3/uL (ref 150.0–400.0)
RBC: 4.43 Mil/uL (ref 3.87–5.11)
RDW: 12.8 % (ref 11.5–15.5)
WBC: 4.7 10*3/uL (ref 4.0–10.5)

## 2017-10-09 LAB — LIPID PANEL
Cholesterol: 201 mg/dL — ABNORMAL HIGH (ref 0–200)
HDL: 67.9 mg/dL (ref >39.00)
LDL Cholesterol: 115 mg/dL — ABNORMAL HIGH (ref 0–99)
NonHDL: 133.57
Total CHOL/HDL Ratio: 3
Triglycerides: 91 mg/dL (ref 0.0–149.0)
VLDL: 18.2 mg/dL (ref 0.0–40.0)

## 2017-10-09 MED ORDER — ESTRADIOL 0.1 MG/GM VA CREA: TOPICAL_CREAM | VAGINAL | 0 refills | Status: DC

## 2017-10-09 NOTE — Progress Notes (Signed)
Subjective:    Mary Mahoney is a 57 y.o. female and is here for a comprehensive physical exam.  Pertinent Gynecological History: Patient's last menstrual period was 07/14/2011. Sexually active: No  Health Maintenance Due  Topic Date Due  . Hepatitis C Screening  1961-02-02  . HIV Screening  08/16/1976  . MAMMOGRAM  05/14/2014  . PAP SMEAR  07/31/2017   PMHx, SurgHx, SocialHx, Medications, and Allergies were reviewed in the Visit Navigator and updated as appropriate.   Past Medical History:  Diagnosis Date  . Anemia    as a younger woman  . Osteoarthritis   . Osteoarthritis   . Separation of AC joint    Left   Past Surgical History:  Procedure Laterality Date  . ACROMIO-CLAVICULAR JOINT REPAIR Left 08/10/2017   Procedure: LEFT ACROMIO-CLAVICULAR JOINT REPAIR;  Surgeon: Tarry Kos, MD;  Location: [facility];  Service: Orthopedics;  Laterality: Left;  . COLONOSCOPY    . EYE SURGERY Bilateral    Lasik surgery  . JOINT REPLACEMENT Right 04/2015   by Dr. [name]  . TOTAL HIP ARTHROPLASTY Right 04/21/2015   Procedure: RIGHT TOTAL HIP ARTHROPLASTY ANTERIOR APPROACH;  Surgeon: Tarry Kos, MD;  Location: [facility];  Service: Orthopedics;  Laterality: Right;   Family History  Problem Relation Age of Onset  . Lung disease Mother   . Heart attack Mother   . Heart disease Mother   . Diabetes Mother   . Peripheral Artery Disease Father    Social History   Tobacco Use  . Smoking status: Former Games developer  . Smokeless tobacco: Never Used  Substance Use Topics  . Alcohol use: Yes    Comment: rare  . Drug use: No    Review of Systems:   Pertinent items are noted in the HPI. Otherwise, ROS is negative.  Objective:   BP 110/66   Pulse (!) 56   Temp 98.1 F (36.7 C) (Oral)   Ht 5' (1.524 m)   Wt 128 lb 9.6 oz (58.3 kg)   LMP 07/14/2011   SpO2 98%   BMI 25.12 kg/m    Wt Readings from Last 3 Encounters:  10/09/17 128 lb 9.6 oz (58.3 kg)  09/21/17 125 lb (56.7 kg)   08/10/17 125 lb (56.7 kg)     Ht Readings from Last 3 Encounters:  10/09/17 5' (1.524 m)  09/21/17 5' (1.524 m)  08/10/17 5' (1.524 m)   General appearance: alert, cooperative and appears stated age. Head: normocephalic, without obvious abnormality, atraumatic. Neck: no adenopathy, supple, symmetrical, trachea midline; thyroid not enlarged, symmetric, no tenderness/mass/nodules. Lungs: clear to auscultation bilaterally. Heart: regular rate and rhythm Abdomen: soft, non-tender; no masses,  no organomegaly. Extremities: extremities normal, atraumatic, no cyanosis or edema. Skin: skin color, texture, turgor normal, no rashes or lesions. Lymph: cervical, supraclavicular, and axillary nodes normal; no abnormal inguinal nodes palpated. Neurologic: grossly normal.  Pelvic:  External genitalia: no lesions.              Urethra: normal appearing urethra with no masses, tenderness or lesions.              Bartholins and Skenes: normal.               Vagina: normal appearing vagina with normal color and discharge, no lesions.              Cervix: normal appearance.  Pap and high risk HPV testing done: Yes.          Bimanual Exam:    Uterus: uterus is normal size, shape, consistency and nontender.                                      Adnexa: normal adnexa in size, nontender and no masses.                                      Assessment/Plan:   Mary Mahoney was seen today for establish care.  Diagnoses and all orders for this visit:  Routine physical examination  Screening for lipid disorders -     Lipid panel  Encounter for hepatitis C virus screening test for high risk patient -     Hepatitis C antibody  Screening for HIV (human immunodeficiency virus) -     HIV antibody  Vaginal discharge -     CBC with Differential/Platelet -     Comprehensive metabolic panel  Pap smear for cervical cancer screening -     Cytology - PAP  Atrophic vaginitis -     estradiol (ESTRACE)  0.1 MG/GM vaginal cream; 0.5 g intravaginally 1-3 times per week.   Patient Counseling:    Nutrition: Stressed importance of moderation in sodium/caffeine intake, saturated fat and cholesterol, caloric balance, sufficient intake of fresh fruits, vegetables, fiber, calcium, iron, and 1 mg of folate supplement per day (for females capable of pregnancy).     Stressed the importance of regular exercise.      Substance Abuse: Discussed cessation/primary prevention of tobacco, alcohol, or other drug use; driving or other dangerous activities under the influence; availability of treatment for abuse.      Injury prevention: Discussed safety belts, safety helmets, smoke detector, smoking near bedding or upholstery.      Sexuality: Discussed sexually transmitted diseases, partner selection, use of condoms, avoidance of unintended pregnancy  and contraceptive alternatives.     Dental health: Discussed importance of regular tooth brushing, flossing, and dental visits.     Health maintenance and immunizations reviewed. Please refer to Health maintenance section.   Helane Rima, DO [facility]

## 2017-10-10 DIAGNOSIS — M25612 Stiffness of left shoulder, not elsewhere classified: Secondary | ICD-10-CM | POA: Diagnosis not present

## 2017-10-10 DIAGNOSIS — M25512 Pain in left shoulder: Secondary | ICD-10-CM | POA: Diagnosis not present

## 2017-10-10 DIAGNOSIS — M629 Disorder of muscle, unspecified: Secondary | ICD-10-CM | POA: Diagnosis not present

## 2017-10-10 LAB — CYTOLOGY - PAP
Bacterial vaginitis: NEGATIVE
Candida vaginitis: NEGATIVE
Chlamydia: NEGATIVE
Diagnosis: NEGATIVE
HPV: NOT DETECTED
Neisseria Gonorrhea: NEGATIVE
Trichomonas: NEGATIVE

## 2017-10-10 LAB — HEPATITIS C ANTIBODY
Hepatitis C Ab: NONREACTIVE
SIGNAL TO CUT-OFF: 0.01 (ref <1.00)

## 2017-10-10 LAB — HIV ANTIBODY (ROUTINE TESTING W REFLEX): HIV 1&2 Ab, 4th Generation: NONREACTIVE

## 2017-10-12 DIAGNOSIS — M25512 Pain in left shoulder: Secondary | ICD-10-CM | POA: Diagnosis not present

## 2017-10-12 DIAGNOSIS — M25612 Stiffness of left shoulder, not elsewhere classified: Secondary | ICD-10-CM | POA: Diagnosis not present

## 2017-10-12 DIAGNOSIS — M629 Disorder of muscle, unspecified: Secondary | ICD-10-CM | POA: Diagnosis not present

## 2017-10-19 DIAGNOSIS — M25512 Pain in left shoulder: Secondary | ICD-10-CM | POA: Diagnosis not present

## 2017-10-19 DIAGNOSIS — M629 Disorder of muscle, unspecified: Secondary | ICD-10-CM | POA: Diagnosis not present

## 2017-10-19 DIAGNOSIS — M25612 Stiffness of left shoulder, not elsewhere classified: Secondary | ICD-10-CM | POA: Diagnosis not present

## 2017-10-22 DIAGNOSIS — M629 Disorder of muscle, unspecified: Secondary | ICD-10-CM | POA: Diagnosis not present

## 2017-10-22 DIAGNOSIS — M25512 Pain in left shoulder: Secondary | ICD-10-CM | POA: Diagnosis not present

## 2017-10-22 DIAGNOSIS — M25612 Stiffness of left shoulder, not elsewhere classified: Secondary | ICD-10-CM | POA: Diagnosis not present

## 2017-10-26 DIAGNOSIS — M629 Disorder of muscle, unspecified: Secondary | ICD-10-CM | POA: Diagnosis not present

## 2017-10-26 DIAGNOSIS — M25612 Stiffness of left shoulder, not elsewhere classified: Secondary | ICD-10-CM | POA: Diagnosis not present

## 2017-10-26 DIAGNOSIS — M25512 Pain in left shoulder: Secondary | ICD-10-CM | POA: Diagnosis not present

## 2017-10-29 DIAGNOSIS — M25512 Pain in left shoulder: Secondary | ICD-10-CM | POA: Diagnosis not present

## 2017-10-29 DIAGNOSIS — M25612 Stiffness of left shoulder, not elsewhere classified: Secondary | ICD-10-CM | POA: Diagnosis not present

## 2017-10-29 DIAGNOSIS — M629 Disorder of muscle, unspecified: Secondary | ICD-10-CM | POA: Diagnosis not present

## 2017-11-02 DIAGNOSIS — M629 Disorder of muscle, unspecified: Secondary | ICD-10-CM | POA: Diagnosis not present

## 2017-11-02 DIAGNOSIS — M25612 Stiffness of left shoulder, not elsewhere classified: Secondary | ICD-10-CM | POA: Diagnosis not present

## 2017-11-02 DIAGNOSIS — M25512 Pain in left shoulder: Secondary | ICD-10-CM | POA: Diagnosis not present

## 2017-11-06 ENCOUNTER — Encounter (INDEPENDENT_AMBULATORY_CARE_PROVIDER_SITE_OTHER): Payer: Self-pay | Admitting: Orthopaedic Surgery

## 2017-11-06 ENCOUNTER — Ambulatory Visit (INDEPENDENT_AMBULATORY_CARE_PROVIDER_SITE_OTHER): Payer: 59

## 2017-11-06 ENCOUNTER — Ambulatory Visit (INDEPENDENT_AMBULATORY_CARE_PROVIDER_SITE_OTHER): Payer: 59 | Admitting: Orthopaedic Surgery

## 2017-11-06 DIAGNOSIS — Z9889 Other specified postprocedural states: Secondary | ICD-10-CM

## 2017-11-06 NOTE — Progress Notes (Signed)
   Post-Op Visit Note   Patient: Mary Mahoney           Date of Birth: 1961/02/17           MRN: [id number] Visit Date: 11/06/2017 PCP: Helane Rima, DO   Assessment & Plan:  Chief Complaint:  Chief Complaint  Patient presents with  . Left Shoulder - Follow-up, Routine Post Op, Pain   Visit Diagnoses:  1. Status post [facility] joint reconstruction     Plan: Patient is almost 3 months status post left AC joint reconstruction.  She is overall doing well.  She does endorse some stiffness and some soreness but not any pain.  She does not her range of motion is progressing nicely.  She mainly lacks about 15 degrees with external rotation and internally rotates to L2.  At this point she may discontinue outpatient physical therapy and just focus on home exercise program.  Like to recheck her in 2 months.  She will need 2 view x-rays of the left clavicle.  Follow-Up Instructions: Return in about 2 months (around 01/06/2018).   Orders:  Orders Placed This Encounter  Procedures  . XR Clavicle Left   No orders of the defined types were placed in this encounter.   Imaging: Xr Clavicle Left  Result Date: 11/06/2017 Stable AC joint reconstruction.    PMFS History: Patient Active Problem List   Diagnosis Date Noted  . Status post [facility] joint reconstruction 08/17/2017  . AC separation, left, initial encounter 08/12/2017  . Osteoarthritis of right hip 04/21/2015  . Hip arthritis 04/21/2015   Past Medical History:  Diagnosis Date  . Anemia    as a younger woman  . Osteoarthritis   . Osteoarthritis   . Separation of AC joint    Left    Family History  Problem Relation Age of Onset  . Lung disease Mother   . Heart attack Mother   . Heart disease Mother   . Diabetes Mother   . Peripheral Artery Disease Father     Past Surgical History:  Procedure Laterality Date  . ACROMIO-CLAVICULAR JOINT REPAIR Left 08/10/2017   Procedure: LEFT ACROMIO-CLAVICULAR JOINT REPAIR;  Surgeon: Tarry Kos, MD;  [facility]: [facility] OR;  Service: Orthopedics;  Laterality: Left;  . COLONOSCOPY    . EYE SURGERY Bilateral    Lasik surgery  . JOINT REPLACEMENT Right 04/2015   by Dr. [name]  . TOTAL HIP ARTHROPLASTY Right 04/21/2015   Procedure: RIGHT TOTAL HIP ARTHROPLASTY ANTERIOR APPROACH;  Surgeon: Tarry Kos, MD;  Location: [facility] OR;  Service: Orthopedics;  Laterality: Right;   Social History   Occupational History  . Not on file  Tobacco Use  . Smoking status: Former Games developer  . Smokeless tobacco: Never Used  Substance and Sexual Activity  . Alcohol use: Yes    Comment: rare  . Drug use: No  . Sexual activity: Not on file

## 2018-01-10 ENCOUNTER — Ambulatory Visit (INDEPENDENT_AMBULATORY_CARE_PROVIDER_SITE_OTHER): Payer: 59 | Admitting: Orthopaedic Surgery

## 2018-01-10 ENCOUNTER — Encounter (INDEPENDENT_AMBULATORY_CARE_PROVIDER_SITE_OTHER): Payer: Self-pay | Admitting: Orthopaedic Surgery

## 2018-01-10 ENCOUNTER — Ambulatory Visit (INDEPENDENT_AMBULATORY_CARE_PROVIDER_SITE_OTHER): Payer: Self-pay

## 2018-01-10 DIAGNOSIS — S43102A Unspecified dislocation of left acromioclavicular joint, initial encounter: Secondary | ICD-10-CM | POA: Diagnosis not present

## 2018-01-10 DIAGNOSIS — Z9889 Other specified postprocedural states: Secondary | ICD-10-CM | POA: Diagnosis not present

## 2018-01-10 NOTE — Progress Notes (Signed)
Office Visit Note   Patient: Mary Mahoney           Date of Birth: 07/23/1960           MRN: [id number] Visit Date: 01/10/2018              Requested by: Helane RimaWallace, Erica, DO [address] [place], [place] [address] PCP: Helane RimaWallace, Erica, DO   Assessment & Plan: Visit Diagnoses:  1. AC separation, left, initial encounter   2. [facility]     Plan: Overall patient is coming along well however she is endorsing some scar tissue tethering from the surgery.  I recommend continued soft tissue mobilization and symptomatic treatment.  I think that this will continue to improve with time.  Overall she is getting around very well is just she is very active and she puts a lot of physical demand on her left shoulder.  I would like to recheck her in about 3 months with her at 8 months and at 4 years status post her hip replacement.  She will need x-rays of her hip at that time.  Follow-Up Instructions: Return in about 3 months (around 04/12/2018).   Orders:  Orders Placed This Encounter  Procedures  . XR Clavicle Left   No orders of the defined types were placed in this encounter.     Procedures: No procedures performed   Clinical Data: No additional findings.   Subjective: Chief Complaint  Patient presents with  . Left Shoulder - Pain    CLAVICLE     Patient is 5 status post left AC joint reconstruction.  She is doing well overall but she states that she still not 100%.  She mainly has pain when she reaches back and when she is exercises.  She when she is doing push-ups or planks or [place] she does have pain in her left shoulder and ability.  In terms of day-to-day activity she does fine and she has no pain.   Review of Systems   Objective: Vital Signs: LMP 07/14/2011   Physical Exam  Ortho Exam Left shoulder exam shows a fully healed surgical scar.  She has some discomfort with reaching back with internal rotation and external rotation with  the shoulder at 90 degrees of abduction. Specialty Comments:  No specialty comments available.  Imaging: Xr Clavicle Left  Result Date: 01/10/2018 Stable AC joint reconstruction    PMFS History: Patient Active Problem List   Diagnosis Date Noted  . Status post [facility] 08/17/2017  . AC separation, left, initial encounter 08/12/2017  . Osteoarthritis of right hip 04/21/2015  . Hip arthritis 04/21/2015   Past Medical History:  Diagnosis Date  . Anemia    as a younger woman  . Osteoarthritis   . Osteoarthritis   . Separation of AC joint    Left    Family History  Problem Relation Age of Onset  . Lung disease Mother   . Heart attack Mother   . Heart disease Mother   . Diabetes Mother   . Peripheral Artery Disease Father     Past Surgical History:  Procedure Laterality Date  . ACROMIO-CLAVICULAR JOINT REPAIR Left 08/10/2017   Procedure: LEFT ACROMIO-CLAVICULAR JOINT REPAIR;  Surgeon: Tarry KosXu, Jamion Carter M, MD;  [facility];  Service: Orthopedics;  Laterality: Left;  . COLONOSCOPY    . EYE SURGERY Bilateral    Lasik surgery  . JOINT REPLACEMENT Right 04/2015   by Dr. Theotis BarrioN. Michael  Latwan Luchsinger  . TOTAL HIP ARTHROPLASTY Right 04/21/2015   Procedure: RIGHT TOTAL HIP ARTHROPLASTY ANTERIOR APPROACH;  Surgeon: Tarry Kos, MD;  [facility];  Service: Orthopedics;  Laterality: Right;   Social History   Occupational History  . Not on file  Tobacco Use  . Smoking status: Former Games developer  . Smokeless tobacco: Never Used  Substance and Sexual Activity  . Alcohol use: Yes    Comment: rare  . Drug use: No  . Sexual activity: Not on file

## 2018-04-16 ENCOUNTER — Encounter (INDEPENDENT_AMBULATORY_CARE_PROVIDER_SITE_OTHER): Payer: Self-pay | Admitting: Orthopaedic Surgery

## 2018-04-16 ENCOUNTER — Ambulatory Visit (INDEPENDENT_AMBULATORY_CARE_PROVIDER_SITE_OTHER): Payer: 59 | Admitting: Orthopaedic Surgery

## 2018-04-16 ENCOUNTER — Ambulatory Visit (INDEPENDENT_AMBULATORY_CARE_PROVIDER_SITE_OTHER): Payer: Self-pay

## 2018-04-16 VITALS — Ht 60.0 in | Wt 128.6 lb

## 2018-04-16 DIAGNOSIS — Z96641 Presence of right artificial hip joint: Secondary | ICD-10-CM | POA: Diagnosis not present

## 2018-04-16 DIAGNOSIS — S43102A Unspecified dislocation of left acromioclavicular joint, initial encounter: Secondary | ICD-10-CM

## 2018-04-16 NOTE — Progress Notes (Signed)
Office Visit Note   Patient: Mary Mahoney           Date of Birth: 25-Sep-1960           MRN: [id number] Visit Date: 04/16/2018              Requested by: Helane Rima, DO [address] [place], [place] [address] PCP: Helane Rima, DO   Assessment & Plan: Visit Diagnoses:  1. AC separation, left, initial encounter   2. Status post right hip replacement     Plan: Mary Mahoney has done very well from her right hip replacement as well as her left AC joint reconstruction.  At this point she will continue to do exercises for her left shoulder.  We will see her back in 2 years for her right hip replacement with AP pelvis and lateral hip at that time.  We will follow-up with her as needed for the left shoulder.  Follow-Up Instructions: Return in about 2 years (around 04/16/2020).   Orders:  Orders Placed This Encounter  Procedures  . XR Clavicle Left  . XR HIP UNILAT W OR W/O PELVIS 2-3 VIEWS RIGHT   No orders of the defined types were placed in this encounter.     Procedures: No procedures performed   Clinical Data: No additional findings.   Subjective: Chief Complaint  Patient presents with  . Left Shoulder - Follow-up    Left clavicle f/u    Mary Mahoney is 3 years status post right total hip replacement.  She is doing very well and recently ran a half marathon without any problems or pain.  She is not limping.  She is very satisfied with her hip replacement.  In terms of her left shoulder she is about 8 months from her [facility] joint reconstruction.  She feels that it is about 90% from full recovery.  It is very functional.  She has no pain on a daily basis but she does endorse some tightness and stiffness.   Review of Systems   Objective: Vital Signs: Ht 5' (1.524 m)   Wt 128 lb 9.6 oz (58.3 kg)   LMP 07/14/2011   BMI 25.12 kg/m   Physical Exam  Ortho Exam Right hip exam is unremarkable. Left shoulder exam shows fully healed surgical scar.  She has near full range  of motion.  Good functional strength. Specialty Comments:  No specialty comments available.  Imaging: Xr Clavicle Left  Result Date: 04/16/2018 Stable AC joint reconstruction  Xr Hip Unilat W Or W/o Pelvis 2-3 Views Right  Result Date: 04/16/2018 Stable right total hip replacement without complication    PMFS History: Patient Active Problem List   Diagnosis Date Noted  . Status post [facility] joint reconstruction 08/17/2017  . AC separation, left, initial encounter 08/12/2017  . Osteoarthritis of right hip 04/21/2015  . Hip arthritis 04/21/2015   Past Medical History:  Diagnosis Date  . Anemia    as a younger woman  . Osteoarthritis   . Osteoarthritis   . Separation of AC joint    Left    Family History  Problem Relation Age of Onset  . Lung disease Mother   . Heart attack Mother   . Heart disease Mother   . Diabetes Mother   . Peripheral Artery Disease Father     Past Surgical History:  Procedure Laterality Date  . ACROMIO-CLAVICULAR JOINT REPAIR Left 08/10/2017   Procedure: LEFT ACROMIO-CLAVICULAR JOINT REPAIR;  Surgeon: Tarry Kos, MD;  [facility];  Service: Orthopedics;  Laterality: Left;  . COLONOSCOPY    . EYE SURGERY Bilateral    Lasik surgery  . JOINT REPLACEMENT Right 04/2015   by Dr. [name]  . TOTAL HIP ARTHROPLASTY Right 04/21/2015   Procedure: RIGHT TOTAL HIP ARTHROPLASTY ANTERIOR APPROACH;  Surgeon: Tarry Kos, MD;  [facility]: [facility] OR;  Service: Orthopedics;  Laterality: Right;   Social History   Occupational History  . Not on file  Tobacco Use  . Smoking status: Former Games developer  . Smokeless tobacco: Never Used  Substance and Sexual Activity  . Alcohol use: Yes    Comment: rare  . Drug use: No  . Sexual activity: Not on file

## 2018-04-22 ENCOUNTER — Other Ambulatory Visit: Payer: Self-pay | Admitting: Family Medicine

## 2018-04-22 DIAGNOSIS — Z1231 Encounter for screening mammogram for malignant neoplasm of breast: Secondary | ICD-10-CM

## 2018-10-14 ENCOUNTER — Encounter: Payer: 59 | Admitting: Family Medicine

## 2019-01-07 ENCOUNTER — Encounter: Payer: Self-pay | Admitting: Family Medicine

## 2019-01-07 ENCOUNTER — Ambulatory Visit (INDEPENDENT_AMBULATORY_CARE_PROVIDER_SITE_OTHER): Payer: 59 | Admitting: Family Medicine

## 2019-01-07 ENCOUNTER — Other Ambulatory Visit: Payer: Self-pay

## 2019-01-07 VITALS — BP 108/67 | HR 64 | Temp 98.6°F | Ht 60.0 in | Wt 128.0 lb

## 2019-01-07 DIAGNOSIS — N904 Leukoplakia of vulva: Secondary | ICD-10-CM

## 2019-01-07 DIAGNOSIS — N9089 Other specified noninflammatory disorders of vulva and perineum: Secondary | ICD-10-CM

## 2019-01-07 MED ORDER — LIDOCAINE HCL URETHRAL/MUCOSAL 2 % EX GEL: 1.0000 "application " | CUTANEOUS | 1 refills | Status: DC | PRN

## 2019-01-07 MED ORDER — CLOBETASOL PROPIONATE 0.05 % EX OINT: 1.0000 "application " | TOPICAL_OINTMENT | Freq: Every day | CUTANEOUS | 1 refills | Status: AC

## 2019-01-07 NOTE — Progress Notes (Signed)
Mary Mahoney is a 58 y.o. female is here for follow up.  History of Present Illness:   HPI: Worsening vulvar pain. Complains of cracks in skin, causing pain with urination and BM. Likes to spin for exercise but stopped months ago due to the pain. Now, very uncomfortable.   Health Maintenance Due  Topic Date Due  . MAMMOGRAM  05/14/2014   Depression screen [facility] 2/9 01/07/2019 10/09/2017  Decreased Interest 0 0  Down, Depressed, Hopeless 0 0  PHQ - 2 Score 0 0  Altered sleeping 1 -  Tired, decreased energy 1 -  Change in appetite 0 -  Feeling bad or failure about yourself  0 -  Trouble concentrating 0 -  Moving slowly or fidgety/restless 0 -  Suicidal thoughts 0 -  PHQ-9 Score 2 -  Difficult doing work/chores Somewhat difficult -   PMHx, SurgHx, SocialHx, FamHx, Medications, and Allergies were reviewed in the Visit Navigator and updated as appropriate.   Patient Active Problem List   Diagnosis Date Noted  . Status post [facility] joint reconstruction 08/17/2017  . AC separation, left, initial encounter 08/12/2017  . Osteoarthritis of right hip 04/21/2015  . Hip arthritis 04/21/2015   Social History   Tobacco Use  . Smoking status: Former Research scientist (life sciences)  . Smokeless tobacco: Never Used  Substance Use Topics  . Alcohol use: Yes    Comment: rare  . Drug use: No   Current Medications and Allergies   .  acetaminophen (TYLENOL) 500 MG tablet, Take 500-1,000 mg by mouth every 8 (eight) hours as needed for mild pain or moderate pain., Disp: , Rfl:  .  Ascorbic Acid (VITAMIN C) 1000 MG tablet, Take 1,000 mg by mouth daily., Disp: , Rfl:  .  Calcium Carb-Cholecalciferol (CALCIUM 600 + D PO), Take 1 tablet by mouth daily., Disp: , Rfl:  .  Chromium 200 MCG CAPS, Take 1 capsule by mouth daily., Disp: , Rfl: : 1 .  estradiol (ESTRACE) 0.1 MG/GM vaginal cream, 0.5 g intravaginally 1-3 times per week., Disp: 42.5 g, Rfl: 0 .  lidocaine (XYLOCAINE) 2 % jelly, Apply 1 application topically as  needed., Disp: 30 mL, Rfl: 1 .  Multiple Vitamin (MULTIVITAMIN WITH MINERALS) TABS tablet, Take 3 tablets by mouth daily., Disp: , Rfl:  .  Multiple Vitamins-Minerals (ANTIOXIDANT PO), Take 1 capsule by mouth daily., Disp: , Rfl:  .  naproxen sodium (ANAPROX) 220 MG tablet, Take 440 mg by mouth 2 (two) times daily as needed. , Disp: , Rfl:  .  NON FORMULARY, Take 1 capsule by mouth daily. Cellular health, Disp: , Rfl:  .  Omega-3 Fatty Acids (FISH OIL PO), Take 1 capsule by mouth daily., Disp: , Rfl:   No Known Allergies   Review of Systems   Pertinent items are noted in the HPI. Otherwise, a complete ROS is negative.  Vitals  There were no vitals filed for this visit.   There is no height or weight on file to calculate BMI.  Physical Exam   Physical Exam Vitals signs and nursing note reviewed.  HENT:     Head: Normocephalic and atraumatic.  Eyes:     Pupils: Pupils are equal, round, and reactive to light.  Neck:     Musculoskeletal: Normal range of motion and neck supple.  Cardiovascular:     Rate and Rhythm: Normal rate and regular rhythm.     Heart sounds: Normal heart sounds.  Pulmonary:     Effort: Pulmonary effort  is normal.  Abdominal:     Palpations: Abdomen is soft.  Genitourinary:    Comments: Pale mucosa with white patches and fissures. Anatomy starting to fuse.  Skin:    General: Skin is warm.  Psychiatric:        Behavior: Behavior normal.     Assessment and Plan   Diagnoses and all orders for this visit:  Lichen sclerosus of female genitalia -     clobetasol ointment (TEMOVATE) 0.05 %; Apply 1 application topically at bedtime. -     Ambulatory referral to Gynecology -     lidocaine (XYLOCAINE) 2 % jelly; Apply 1 application topically as needed.  Vulvar lesion -     Herpes simplex virus culture -     TEST IN QUESTION AMBIGUOUS ORDER    . Orders and follow up as documented in EpicCare, reviewed diet, exercise and weight control, cardiovascular  risk and specific lipid/LDL goals reviewed, reviewed medications and side effects in detail.  . Reviewed expectations re: course of current medical issues. . Outlined signs and symptoms indicating need for more acute intervention. . Patient verbalized understanding and all questions were answered. . Patient received an After Visit Summary.  Helane RimaErica Aria Pickrell, DO [place] 01/08/2019

## 2019-01-07 NOTE — Patient Instructions (Signed)
Lichen Sclerosus Lichen sclerosus is a skin problem. It can happen on any part of the body, but it commonly involves the anal or genital areas. It can cause itching and discomfort in these areas. Treatment can help to control symptoms. When the genital area is affected, getting treatment is important because the condition can cause scarring that may lead to other problems. What are the causes? The cause of this condition is not known. It may be related to an overactive immune system or a lack of certain hormones. Lichen sclerosus is not an infection or a fungus, and it is not passed from one person to another (not contagious). What increases the risk? This condition is more likely to develop in women, usually after menopause. What are the signs or symptoms? Symptoms of this condition include:  Thin, wrinkled, white areas on the skin.  Thickened white areas on the skin.  Red and swollen patches (lesions) on the skin.  Tears or cracks in the skin.  Bruising.  Blood blisters.  Severe itching.  Pain, itching, or burning when urinating. Constipation is also common in people with lichen sclerosus. How is this diagnosed? This condition may be diagnosed with a physical exam. In some cases, a tissue sample (biopsy sample) may be removed to be looked at under a microscope. How is this treated? This condition is usually treated with medicated creams or ointments (topical steroids) that are applied over the affected areas. In some cases, treatment may also include medicines that are taken by mouth. Surgery may be needed in more severe cases that are causing problems such as scarring. Follow these instructions at home:  Take or use over-the-counter and prescription medicines only as told by your health care provider.  Use creams or ointments as told by your health care provider.  Do not scratch the affected areas of skin.  If you are a woman, be sure to keep the vaginal area as clean and dry  as possible.  Clean the affected area of skin gently with water. Avoid using rough towels or toilet paper.  Keep all follow-up visits as told by your health care provider. This is important. Contact a health care provider if:  You have increasing redness, swelling, or pain in the affected area.  You have fluid, blood, or pus coming from the affected area.  You have new lesions on your skin.  You have a fever.  You have pain during sex. Summary  Lichen sclerosus is a skin problem. When the genital area is affected, getting treatment is important because the condition can cause scarring that may lead to other problems.  This condition is usually treated with medicated creams or ointments (topical steroids) that are applied over the affected areas.  Take or use over-the-counter and prescription medicines only as told by your health care provider.  Contact a health care provider if you have new lesions on your skin, have pain during sex, or have increasing redness, swelling, or pain in the affected area.  Keep all follow-up visits as told by your health care provider. This is important. This information is not intended to replace advice given to you by your health care provider. Make sure you discuss any questions you have with your health care provider. Document Released: 10/19/2010 Document Revised: 10/11/2017 Document Reviewed: 10/11/2017 Elsevier Patient Education  2020 Elsevier Inc.  

## 2019-01-08 ENCOUNTER — Encounter: Payer: Self-pay | Admitting: Family Medicine

## 2019-01-08 LAB — TIQ- AMBIGUOUS ORDER

## 2019-01-09 ENCOUNTER — Telehealth: Payer: Self-pay | Admitting: Physical Therapy

## 2019-01-09 LAB — HERPES SIMPLEX VIRUS CULTURE
MICRO NUMBER:: 711471
SPECIMEN QUALITY:: ADEQUATE

## 2019-01-09 NOTE — Telephone Encounter (Signed)
I spoke with [facility] from [place] and gave them the source of the specimen taken on 01/07/19, which was the Vulvar.

## 2019-01-09 NOTE — Telephone Encounter (Signed)
[facility],  I think this is meant for you but hard to tell.  I asked Vaughan Basta about it and she said she thought it might be for you all since she doesn't do swabs.  Thanks, [facility]  Copied from [employer] [id number]. Topic: General - Other >> Jan 09, 2019  1:27 PM Yvette Rack wrote: Reason for CRM: Harlene Ramus with [employer] stated they received a swab sample and need to know the source of the sample. Cb# [phone] Reference# [id number] K

## 2019-01-13 ENCOUNTER — Encounter: Payer: Self-pay | Admitting: Obstetrics and Gynecology

## 2019-01-22 ENCOUNTER — Other Ambulatory Visit: Payer: Self-pay

## 2019-01-22 ENCOUNTER — Ambulatory Visit (INDEPENDENT_AMBULATORY_CARE_PROVIDER_SITE_OTHER): Payer: 59 | Admitting: Family Medicine

## 2019-01-22 ENCOUNTER — Encounter: Payer: Self-pay | Admitting: Family Medicine

## 2019-01-22 VITALS — BP 110/64 | HR 61 | Temp 97.3°F | Ht 60.0 in | Wt 130.2 lb

## 2019-01-22 DIAGNOSIS — E559 Vitamin D deficiency, unspecified: Secondary | ICD-10-CM | POA: Diagnosis not present

## 2019-01-22 DIAGNOSIS — E7800 Pure hypercholesterolemia, unspecified: Secondary | ICD-10-CM | POA: Diagnosis not present

## 2019-01-22 DIAGNOSIS — Z0000 Encounter for general adult medical examination without abnormal findings: Secondary | ICD-10-CM

## 2019-01-22 DIAGNOSIS — N904 Leukoplakia of vulva: Secondary | ICD-10-CM | POA: Diagnosis not present

## 2019-01-22 DIAGNOSIS — Z1239 Encounter for other screening for malignant neoplasm of breast: Secondary | ICD-10-CM

## 2019-01-22 DIAGNOSIS — Z96641 Presence of right artificial hip joint: Secondary | ICD-10-CM | POA: Insufficient documentation

## 2019-01-22 LAB — COMPREHENSIVE METABOLIC PANEL
ALT: 32 U/L (ref 0–35)
AST: 23 U/L (ref 0–37)
Albumin: 4.6 g/dL (ref 3.5–5.2)
Alkaline Phosphatase: 79 U/L (ref 39–117)
BUN: 13 mg/dL (ref 6–23)
CO2: 28 mEq/L (ref 19–32)
Calcium: 9.9 mg/dL (ref 8.4–10.5)
Chloride: 104 mEq/L (ref 96–112)
Creatinine, Ser: 0.83 mg/dL (ref 0.40–1.20)
GFR: 70.75 mL/min (ref >60.00)
Glucose, Bld: 85 mg/dL (ref 70–99)
Potassium: 4.4 mEq/L (ref 3.5–5.1)
Sodium: 140 mEq/L (ref 135–145)
Total Bilirubin: 0.6 mg/dL (ref 0.2–1.2)
Total Protein: 6.4 g/dL (ref 6.0–8.3)

## 2019-01-22 LAB — LIPID PANEL
Cholesterol: 203 mg/dL — ABNORMAL HIGH (ref 0–200)
HDL: 60.2 mg/dL (ref >39.00)
LDL Cholesterol: 125 mg/dL — ABNORMAL HIGH (ref 0–99)
NonHDL: 142.56
Total CHOL/HDL Ratio: 3
Triglycerides: 88 mg/dL (ref 0.0–149.0)
VLDL: 17.6 mg/dL (ref 0.0–40.0)

## 2019-01-22 LAB — CBC WITH DIFFERENTIAL/PLATELET
Basophils Absolute: 0 10*3/uL (ref 0.0–0.1)
Basophils Relative: 0.8 % (ref 0.0–3.0)
Eosinophils Absolute: 0.1 10*3/uL (ref 0.0–0.7)
Eosinophils Relative: 1.9 % (ref 0.0–5.0)
HCT: 41 % (ref 36.0–46.0)
Hemoglobin: 14 g/dL (ref 12.0–15.0)
Lymphocytes Relative: 37.5 % (ref 12.0–46.0)
Lymphs Abs: 2.1 10*3/uL (ref 0.7–4.0)
MCHC: 34.2 g/dL (ref 30.0–36.0)
MCV: 90.3 fl (ref 78.0–100.0)
Monocytes Absolute: 0.4 10*3/uL (ref 0.1–1.0)
Monocytes Relative: 8 % (ref 3.0–12.0)
Neutro Abs: 2.9 10*3/uL (ref 1.4–7.7)
Neutrophils Relative %: 51.8 % (ref 43.0–77.0)
Platelets: 219 10*3/uL (ref 150.0–400.0)
RBC: 4.54 Mil/uL (ref 3.87–5.11)
RDW: 12.6 % (ref 11.5–15.5)
WBC: 5.5 10*3/uL (ref 4.0–10.5)

## 2019-01-22 LAB — VITAMIN D 25 HYDROXY (VIT D DEFICIENCY, FRACTURES): VITD: 27.31 ng/mL — ABNORMAL LOW (ref 30.00–100.00)

## 2019-01-22 NOTE — Patient Instructions (Addendum)
You have an appointment scheduled for: []   2D Mammogram  [x]   3D Mammogram  []   Bone Density   02/13/11 arrive at 12:10   Your appointment will at the following location  [x]   [facility]      [place], [place]            Make sure to wear two peace clothing  No lotions powders or deodorants the day of the appointment Make sure to bring picture ID and insurance card.  Bring list of medications you are currently taking including any supplements.

## 2019-01-22 NOTE — Progress Notes (Signed)
Subjective:    Sabryn L [place] is a 58 y.o. female and is here for a comprehensive physical exam.  Health Maintenance  Topic Date Due  . MAMMOGRAM  05/14/2014  . INFLUENZA VACCINE  01/11/2019  . PAP SMEAR-Modifier  10/09/2020  . TETANUS/TDAP  04/02/2022  . COLONOSCOPY  09/21/2024  . Hepatitis C Screening  Completed  . HIV Screening  Completed   Current Outpatient Medications:  .  Ascorbic Acid (VITAMIN C) 1000 MG tablet, Take 1,000 mg by mouth daily., Disp: , Rfl:  .  Calcium Carb-Cholecalciferol (CALCIUM 600 + D PO), Take 1 tablet by mouth daily., Disp: , Rfl:  .  Chromium 200 MCG CAPS, Take 1 capsule by mouth daily., Disp: , Rfl:  .  clobetasol ointment (TEMOVATE) 0.05 %, Apply 1 application topically at bedtime., Disp: 30 g, Rfl: 1 .  Multiple Vitamin (MULTIVITAMIN WITH MINERALS) TABS tablet, Take 3 tablets by mouth daily., Disp: , Rfl:  .  naproxen sodium (ANAPROX) 220 MG tablet, Take 440 mg by mouth 2 (two) times daily as needed. , Disp: , Rfl:  .  NON FORMULARY, Take 1 capsule by mouth daily. Cellular health, Disp: , Rfl:  .  Omega-3 Fatty Acids (FISH OIL PO), Take 1 capsule by mouth daily., Disp: , Rfl:   PMHx, SurgHx, SocialHx, Medications, and Allergies were reviewed in the Visit Navigator and updated as appropriate.   Past Medical History:  Diagnosis Date  . Anemia, as younger woman   . Osteoarthritis   . Separation of AC joint, left, s/p repair      Past Surgical History:  Procedure Laterality Date  . ACROMIO-CLAVICULAR JOINT REPAIR Left 08/10/2017   Procedure: LEFT ACROMIO-CLAVICULAR JOINT REPAIR;  Surgeon: Tarry KosXu, Naiping M, MD;  [facility];  Service: Orthopedics;  Laterality: Left;  . COLONOSCOPY  09/22/2014  . REFRACTIVE SURGERY Bilateral   . TOTAL HIP ARTHROPLASTY Right 04/21/2015   Procedure: RIGHT TOTAL HIP ARTHROPLASTY ANTERIOR APPROACH;  Surgeon: Tarry KosNaiping M Xu, MD;  Location: [facility] OR;  Service: Orthopedics;  Laterality: Right;     Family History    Problem Relation Age of Onset  . Lung disease Mother   . Heart attack Mother   . Heart disease Mother   . Diabetes Mother   . Peripheral Artery Disease Father     Social History   Tobacco Use  . Smoking status: Former Games developermoker  . Smokeless tobacco: Never Used  Substance Use Topics  . Alcohol use: Yes    Comment: rare  . Drug use: No    Review of Systems:   Pertinent items are noted in the HPI. Otherwise, ROS is negative.  Objective:   BP 110/64   Pulse 61   Temp (!) 97.3 F (36.3 C) (Oral)   Ht 5' (1.524 m)   Wt 130 lb 3.2 oz (59.1 kg)   LMP 07/14/2011   SpO2 96%   BMI 25.43 kg/m   General appearance: alert, cooperative and appears stated age. Head: normocephalic, without obvious abnormality, atraumatic. Neck: no adenopathy, supple, symmetrical, trachea midline; thyroid not enlarged, symmetric, no tenderness/mass/nodules. Lungs: clear to auscultation bilaterally. Heart: regular rate and rhythm Abdomen: soft, non-tender; no masses,  no organomegaly. Extremities: extremities normal, atraumatic, no cyanosis or edema. Skin: skin color, texture, turgor normal, no rashes or lesions. Lymph: cervical, supraclavicular, and axillary nodes normal; no abnormal inguinal nodes palpated. Neurologic: grossly normal.  Assessment/Plan:   Diagnoses and all orders for this visit:  Routine physical examination  Pure  hypercholesterolemia Comments: Very mild.  Patient is very active and eats healthy foods.  ASCVD is less than 2.  No statin indicated. Orders: -     Comprehensive metabolic panel -     Lipid panel  Lichen sclerosus of female genitalia Comments: Improving with clobetasol topical.  Will be seeing Dr. Talbert Nan in 2 days. Orders: -     CBC with Differential/Platelet  Vitamin D deficiency -     VITAMIN D 25 Hydroxy (Vit-D Deficiency, Fractures)  Breast cancer screening Comments: Appointment made for patient today. Orders: -     MM DIGITAL SCREENING BILATERAL;  Future   Patient Counseling: [x]    Nutrition: Stressed importance of moderation in sodium/caffeine intake, saturated fat and cholesterol, caloric balance, sufficient intake of fresh fruits, vegetables, fiber, calcium, iron, and 1 mg of folate supplement per day (for females capable of pregnancy).  [x]    Stressed the importance of regular exercise.   [x]    Substance Abuse: Discussed cessation/primary prevention of tobacco, alcohol, or other drug use; driving or other dangerous activities under the influence; availability of treatment for abuse.   [x]    Injury prevention: Discussed safety belts, safety helmets, smoke detector, smoking near bedding or upholstery.   [x]    Sexuality: Discussed sexually transmitted diseases, partner selection, use of condoms, avoidance of unintended pregnancy  and contraceptive alternatives.  [x]    Dental health: Discussed importance of regular tooth brushing, flossing, and dental visits.  [x]    Health maintenance and immunizations reviewed. Please refer to Health maintenance section.   Briscoe Deutscher, DO [place]

## 2019-01-24 ENCOUNTER — Other Ambulatory Visit: Payer: Self-pay

## 2019-01-24 ENCOUNTER — Ambulatory Visit (INDEPENDENT_AMBULATORY_CARE_PROVIDER_SITE_OTHER): Payer: 59 | Admitting: Obstetrics and Gynecology

## 2019-01-24 ENCOUNTER — Encounter: Payer: Self-pay | Admitting: Obstetrics and Gynecology

## 2019-01-24 VITALS — BP 100/68 | HR 60 | Temp 98.0°F | Ht 60.0 in | Wt 130.0 lb

## 2019-01-24 DIAGNOSIS — K602 Anal fissure, unspecified: Secondary | ICD-10-CM

## 2019-01-24 DIAGNOSIS — N763 Subacute and chronic vulvitis: Secondary | ICD-10-CM | POA: Diagnosis not present

## 2019-01-24 DIAGNOSIS — N941 Unspecified dyspareunia: Secondary | ICD-10-CM | POA: Diagnosis not present

## 2019-01-24 DIAGNOSIS — N952 Postmenopausal atrophic vaginitis: Secondary | ICD-10-CM

## 2019-01-24 MED ORDER — ESTRADIOL 0.1 MG/GM VA CREA: TOPICAL_CREAM | VAGINAL | 1 refills | Status: DC

## 2019-01-24 MED ORDER — "NITROGLYCERIN NICU 2% OINTMENT ": 1.0000 "application " | TOPICAL_OINTMENT | Freq: Two times a day (BID) | TRANSDERMAL | 1 refills | Status: DC

## 2019-01-24 NOTE — Patient Instructions (Signed)
Anal Fissure, Adult  An anal fissure is a small tear or crack in the tissue around the opening of the butt (anus). Bleeding from the tear or crack usually stops on its own within a few minutes. The bleeding may happen every time you poop (have a bowel movement) until the tear or crack heals. What are the causes? This condition is usually caused by passing a large or hard poop (stool). Other causes include:  Trouble pooping (constipation).  Passing watery poop (diarrhea).  Inflammatory bowel disease (Crohn's disease or ulcerative colitis).  Childbirth.  Infections.  Anal sex. What are the signs or symptoms? Symptoms of this condition include:  Bleeding from the butt.  Small amounts of blood on your poop. The blood coats the outside of the poop. It is not mixed with the poop.  Small amounts of blood on the toilet paper or in the toilet after you poop.  Pain when passing poop.  Itching or irritation around the opening of the butt. How is this diagnosed? This condition may be diagnosed based on a physical exam. Your doctor may:  Check your butt. A tear can often be seen by checking the area with care.  Check your butt using a short tube (anoscope). The light in the tube will show any problems in your butt. How is this treated? Treatment for this condition may include:  Treating problems that make it hard for you to pass poop. You may be told to: ? Eat more fiber. ? Drink more fluid. ? Take fiber supplements. ? Take medicines that make poop soft.  Taking sitz baths. This may help to heal the tear.  Using creams and ointments. If your condition gets worse, other treatments may be needed such as:  A shot near the tear or crack (botulinum injection).  Surgery to repair the tear or crack. Follow these instructions at home: Eating and drinking   Avoid bananas and dairy products. These foods can make it hard to poop.  Drink enough fluid to keep your pee (urine) pale  yellow.  Eat foods that have a lot of fiber in them, such as: ? Beans. ? Whole grains. ? Fresh fruits. ? Fresh vegetables. General instructions   Take over-the-counter and prescription medicines only as told by your doctor.  Use creams or ointments only as told by your doctor.  Keep the butt area as clean and dry as you can.  Take a warm water bath (sitz bath) as told by your doctor. Do not use soap.  Keep all follow-up visits as told by your doctor. This is important. Contact a doctor if:  You have more bleeding.  You have a fever.  You have watery poop that is mixed with blood.  You have pain.  Your problem gets worse, not better. Summary  An anal fissure is a small tear or crack in the skin around the opening of the butt (anus).  This condition is usually caused by passing a large or hard poop (stool).  Treatment includes treating the problems that make it hard for you to pass poop.  Follow your doctor's instructions about caring for your condition at home.  Keep all follow-up visits as told by your doctor. This is important. This information is not intended to replace advice given to you by your health care provider. Make sure you discuss any questions you have with your health care provider. Document Released: 01/25/2011 Document Revised: 11/08/2017 Document Reviewed: 11/08/2017 Elsevier Patient Education  2020 [employer] Atrophic  Vaginitis  Atrophic vaginitis is a condition in which the tissues that line the vagina become dry and thin. This condition is most common in women who have stopped having regular menstrual periods (are in menopause). This usually starts when a woman is 445-562 years old. That is the time when a woman's estrogen levels begin to drop (decrease). Estrogen is a female hormone. It helps to keep the tissues of the vagina moist. It stimulates the vagina to produce a clear fluid that lubricates the vagina for sexual intercourse. This fluid also  protects the vagina from infection. Lack of estrogen can cause the lining of the vagina to get thinner and dryer. The vagina may also shrink in size. It may become less elastic. Atrophic vaginitis tends to get worse over time as a woman's estrogen level drops. What are the causes? This condition is caused by the normal drop in estrogen that happens around the time of menopause. What increases the risk? Certain conditions or situations may lower a woman's estrogen level, leading to a higher risk for atrophic vaginitis. You are more likely to develop this condition if:  You are taking medicines that block estrogen.  You have had your ovaries removed.  You are being treated for cancer with X-ray (radiation) or medicines (chemotherapy).  You have given birth or are breastfeeding.  You are older than age 57.  You smoke. What are the signs or symptoms? Symptoms of this condition include:  Pain, soreness, or bleeding during sexual intercourse (dyspareunia).  Vaginal burning, irritation, or itching.  Pain or bleeding when a speculum is used in a vaginal exam (pelvic exam).  Having burning pain when passing urine.  Vaginal discharge that is brown or yellow. In some cases, there are no symptoms. How is this diagnosed? This condition is diagnosed by taking a medical history and doing a physical exam. This will include a pelvic exam that checks the vaginal tissues. Though rare, you may also have other tests, including:  A urine test.  A test that checks the acid balance in your vagina (acid balance test). How is this treated? Treatment for this condition depends on how severe your symptoms are. Treatment may include:  Using an over-the-counter vaginal lubricant before sex.  Using a long-acting vaginal moisturizer.  Using low-dose vaginal estrogen for moderate to severe symptoms that do not respond to other treatments. Options include creams, tablets, and inserts (vaginal rings). Before  you use a vaginal estrogen, tell your health care provider if you have a history of: ? Breast cancer. ? Endometrial cancer. ? Blood clots. If you are not sexually active and your symptoms are very mild, you may not need treatment. Follow these instructions at home: Medicines  Take over-the-counter and prescription medicines only as told by your health care provider. Do not use herbal or alternative medicines unless your health care provider says that you can.  Use over-the-counter creams, lubricants, or moisturizers for dryness only as directed by your health care provider. General instructions  If your atrophic vaginitis is caused by menopause, discuss all of your menopause symptoms and treatment options with your health care provider.  Do not douche.  Do not use products that can make your vagina dry. These include: ? Scented feminine sprays. ? Scented tampons. ? Scented soaps.  Vaginal intercourse can help to improve blood flow and elasticity of vaginal tissue. If it hurts to have sex, try using a lubricant or moisturizer just before having intercourse. Contact a health care provider if:  Your discharge looks different than normal.  Your vagina has an unusual smell.  You have new symptoms.  Your symptoms do not improve with treatment.  Your symptoms get worse. Summary  Atrophic vaginitis is a condition in which the tissues that line the vagina become dry and thin. It is most common in women who have stopped having regular menstrual periods (are in menopause).  Treatment options include using vaginal lubricants and low-dose vaginal estrogen.  Contact a health care provider if your vagina has an unusual smell, or if your symptoms get worse or do not improve after treatment. This information is not intended to replace advice given to you by your health care provider. Make sure you discuss any questions you have with your health care provider. Document Released: 10/13/2014  Document Revised: 05/11/2017 Document Reviewed: 02/22/2017 Elsevier Patient Education  2020 [employer] Inc. Lichen Sclerosus Lichen sclerosus is a skin problem. It can happen on any part of the body, but it commonly involves the anal or genital areas. It can cause itching and discomfort in these areas. Treatment can help to control symptoms. When the genital area is affected, getting treatment is important because the condition can cause scarring that may lead to other problems. What are the causes? The cause of this condition is not known. It may be related to an overactive immune system or a lack of certain hormones. Lichen sclerosus is not an infection or a fungus, and it is not passed from one person to another (not contagious). What increases the risk? This condition is more likely to develop in women, usually after menopause. What are the signs or symptoms? Symptoms of this condition include:  Thin, wrinkled, white areas on the skin.  Thickened white areas on the skin.  Red and swollen patches (lesions) on the skin.  Tears or cracks in the skin.  Bruising.  Blood blisters.  Severe itching.  Pain, itching, or burning when urinating. Constipation is also common in people with lichen sclerosus. How is this diagnosed? This condition may be diagnosed with a physical exam. In some cases, a tissue sample (biopsy sample) may be removed to be looked at under a microscope. How is this treated? This condition is usually treated with medicated creams or ointments (topical steroids) that are applied over the affected areas. In some cases, treatment may also include medicines that are taken by mouth. Surgery may be needed in more severe cases that are causing problems such as scarring. Follow these instructions at home:  Take or use over-the-counter and prescription medicines only as told by your health care provider.  Use creams or ointments as told by your health care provider.  Do not  scratch the affected areas of skin.  If you are a woman, be sure to keep the vaginal area as clean and dry as possible.  Clean the affected area of skin gently with water. Avoid using rough towels or toilet paper.  Keep all follow-up visits as told by your health care provider. This is important. Contact a health care provider if:  You have increasing redness, swelling, or pain in the affected area.  You have fluid, blood, or pus coming from the affected area.  You have new lesions on your skin.  You have a fever.  You have pain during sex. Summary  Lichen sclerosus is a skin problem. When the genital area is affected, getting treatment is important because the condition can cause scarring that may lead to other problems.  This condition is  usually treated with medicated creams or ointments (topical steroids) that are applied over the affected areas.  Take or use over-the-counter and prescription medicines only as told by your health care provider.  Contact a health care provider if you have new lesions on your skin, have pain during sex, or have increasing redness, swelling, or pain in the affected area.  Keep all follow-up visits as told by your health care provider. This is important. This information is not intended to replace advice given to you by your health care provider. Make sure you discuss any questions you have with your health care provider. Document Released: 10/19/2010 Document Revised: 10/11/2017 Document Reviewed: 10/11/2017 Elsevier Patient Education  2020 [employer].

## 2019-01-24 NOTE — Progress Notes (Signed)
58 y.o. [id number] Domestic Partner White or Caucasian Not Hispanic or Latino female here as a consultation from Dr Earlene PlaterWallace for a vulvar condition.    She originally had vulvar symptoms last year and was given an estrogen cream which didn't help. Symptoms have progressively worsened in the last year, she has developed fissures, spots of increased irritation and severe itching. She was started on clobetasol ointment by Dr Earlene PlaterWallace a few weeks ago and is feeling much improved.  Sex is not possible, too painful, the skin is growing together posteriorly and she feels too tight. No vaginal pain, it's all on the outside and tight at the opening.  In addition she is having skin irritation in the perianal region, BM's are very painful, also has hemorrhoid. She started a steroid ointment a couple of weeks ago which is really helping.  She runs 4-5 days a week, 3.5-10 miles. Gets really sweaty. She also bikes and takes spinning classes (prior to covid).    Patient's last menstrual period was 07/14/2011.          Sexually active: No.  The current method of family planning is post menopausal status.    Exercising: Yes.    running Smoker:  no  Health Maintenance: Pap:  10/09/17 Neg. HR HPV:neg  History of abnormal Pap:  no MMG:  2013. Has appt scheduled 02/13/19 BMD:   Never Colonoscopy: 09/22/14 f/u 10 years  TDaP:  2013 Gardasil: n/a   reports that she has quit smoking. She has never used smokeless tobacco. She reports current alcohol use. She reports that she does not use drugs. 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 She has 57 year old twin girls.   Past Medical History:  Diagnosis Date  . Anemia, as younger woman   . Osteoarthritis   . Separation of AC joint, left, s/p repair     Past Surgical History:  Procedure Laterality Date  . ACROMIO-CLAVICULAR JOINT REPAIR Left 08/10/2017   Procedure: LEFT ACROMIO-CLAVICULAR JOINT REPAIR;  Surgeon: Tarry KosXu, Naiping M, MD;  Location: [facility] OR;   Service: Orthopedics;  Laterality: Left;  . COLONOSCOPY  09/22/2014  . REFRACTIVE SURGERY Bilateral   . TOTAL HIP ARTHROPLASTY Right 04/21/2015   Procedure: RIGHT TOTAL HIP ARTHROPLASTY ANTERIOR APPROACH;  Surgeon: Tarry KosNaiping M Xu, MD;  Location: [facility] OR;  Service: Orthopedics;  Laterality: Right;    Current Outpatient Medications  Medication Sig Dispense Refill  . Ascorbic Acid (VITAMIN C) 1000 MG tablet Take 1,000 mg by mouth daily.    . Calcium Carb-Cholecalciferol (CALCIUM 600 + D PO) Take 1 tablet by mouth daily.    . Chromium 200 MCG CAPS Take 1 capsule by mouth daily.    . clobetasol ointment (TEMOVATE) 0.05 % Apply 1 application topically at bedtime. 30 g 1  . Lidocaine-Glycerin (PREPARATION H EX) Apply topically daily as needed.    . Multiple Vitamin (MULTI-VITAMIN) tablet daily.  by Theophilus BonesErica Habersham CMA daily    . NON FORMULARY Take 1 capsule by mouth daily. Cellular health    . acetaminophen (TYLENOL) 500 MG tablet Take 500-1,000 mg by mouth every 8 (eight) hours as needed for mild pain or moderate pain.     No current facility-administered medications for this visit.     Family History  Problem Relation Age of Onset  . Lung disease Mother   . Heart attack Mother   . Heart disease Mother   . Diabetes Mother   . Peripheral Artery Disease Father   .  Lung cancer Father   . Melanoma Paternal Uncle   . Cancer Paternal Grandmother     Review of Systems  Constitutional: Negative.   HENT: Negative.   Eyes: Negative.   Respiratory: Negative.   Cardiovascular: Negative.   Gastrointestinal:       Hemorrhoids   Endocrine: Negative.   Genitourinary:       Vulvar irritation   Musculoskeletal: Negative.   Skin: Negative.   Allergic/Immunologic: Negative.   Neurological: Negative.   Hematological: Negative.   Psychiatric/Behavioral: Negative.     Exam:   BP 100/68   Pulse 60   Temp 98 F (36.7 C) (Temporal)   Ht 5' (1.524 m)   Wt 130 lb (59 kg)   LMP 07/14/2011   BMI  25.39 kg/m   Weight change: @WEIGHTCHANGE @ Height:   Height: 5' (152.4 cm)  Ht Readings from Last 3 Encounters:  01/24/19 5' (1.524 m)  01/22/19 5' (1.524 m)  01/09/19 5' (1.524 m)    General appearance: alert, cooperative and appears stated age   Pelvic: External genitalia:  Mild agglutination of labia minora to minora, mild whitening, most pronounced under the clitoris and on the perineum. Some scarring in the posterior fourchette.               Urethra:  normal appearing urethra with no masses, tenderness or lesions              Bartholins and Skenes: normal                 Vagina: atrophic appearing vagina with normal color and discharge, no lesions. Tight introitus              Cervix: no lesions               Bimanual Exam:  Uterus:  normal size, contour, position, consistency, mobility, non-tender              Adnexa: no mass, fullness, tenderness               Anus: fissure at 12 o'clock, swelling in the perianal region  The risks of the procedure were reviewed with the patient and a consent was signed. The area was cleansed with Hibiclens and injected with 1% lidocaine. A 4 mm punch biopsy was used to remove a circular piece of tissue just under the clitoris. The defect was closed with 4-0 vicryl. The patient tolerated the procedure well.   Chaperone was present for exam.  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  Vulvar skin care information given  Start vaginal estrogen, will apply a pea sized amount to the posterior fourchette in addition to the vaginal application  May need vaginal dilators  Discussed treatment of anal fissure, start metamucil, sitz baths, nitroglycerin ointment BID for one month  F/U in one month  CC: Dr  Juleen China Note sent

## 2019-01-25 LAB — VAGINITIS/VAGINOSIS, DNA PROBE
Candida Species: NEGATIVE
Gardnerella vaginalis: NEGATIVE
Trichomonas vaginosis: NEGATIVE

## 2019-02-13 ENCOUNTER — Other Ambulatory Visit: Payer: Self-pay

## 2019-02-13 ENCOUNTER — Ambulatory Visit
Admission: RE | Admit: 2019-02-13 | Discharge: 2019-02-13 | Disposition: A | Discharge status: Home / Self Care | Payer: 59 | Source: Ambulatory Visit | Attending: Family Medicine | Admitting: Family Medicine

## 2019-02-13 DIAGNOSIS — Z1231 Encounter for screening mammogram for malignant neoplasm of breast: Secondary | ICD-10-CM

## 2019-02-18 ENCOUNTER — Other Ambulatory Visit: Payer: Self-pay | Admitting: Family Medicine

## 2019-02-18 DIAGNOSIS — R928 Other abnormal and inconclusive findings on diagnostic imaging of breast: Secondary | ICD-10-CM

## 2019-02-21 ENCOUNTER — Other Ambulatory Visit: Payer: 59

## 2019-02-25 ENCOUNTER — Other Ambulatory Visit: Payer: Self-pay

## 2019-02-25 ENCOUNTER — Telehealth: Payer: Self-pay | Admitting: Family Medicine

## 2019-02-25 NOTE — Telephone Encounter (Signed)
See note  Copied from [place] [phone]. Topic: Quick Communication - See Telephone Encounter >> Feb 25, 2019  9:33 AM Loma [place] wrote: CRM for notification. See Telephone encounter for: 02/25/19.  [facility] calling, This pt had additional orders to be requested from the original views, pt was want them to do, they are with [facility]. They sch her for TODAY at 3:00 but they need a verbal order to be sent in order to keep the appt. Will Dr Juleen China call in a verbal to [phone]

## 2019-02-25 NOTE — Telephone Encounter (Signed)
Called spoke to [facility] gave orders for patient.

## 2019-02-26 ENCOUNTER — Encounter: Payer: Self-pay | Admitting: Obstetrics and Gynecology

## 2019-02-26 ENCOUNTER — Ambulatory Visit (INDEPENDENT_AMBULATORY_CARE_PROVIDER_SITE_OTHER): Payer: 59 | Admitting: Obstetrics and Gynecology

## 2019-02-26 VITALS — BP 116/60 | HR 72 | Temp 97.2°F | Resp 12 | Wt 130.0 lb

## 2019-02-26 DIAGNOSIS — N941 Unspecified dyspareunia: Secondary | ICD-10-CM | POA: Diagnosis not present

## 2019-02-26 DIAGNOSIS — N952 Postmenopausal atrophic vaginitis: Secondary | ICD-10-CM | POA: Diagnosis not present

## 2019-02-26 DIAGNOSIS — N904 Leukoplakia of vulva: Secondary | ICD-10-CM

## 2019-02-26 NOTE — Patient Instructions (Signed)
Use the steroid ointment 2 x a week, if the whitening improves you can stop doing this. You can intermittently use the steroid ointment daily for up to 2 weeks at a time if needed  Continue using the vaginal estrogen 2 x a week, apply a pea sized amount to the opening of the vagina as well as placing one gram in the vagina  Get vaginal dilators

## 2019-02-26 NOTE — Progress Notes (Signed)
4GYNECOLOGY  VISIT   HPI: 58 y.o.   Domestic Partner White or Caucasian Not Hispanic or Latino  female   [phone] with Patient's last menstrual period was 07/14/2011.   here for f/u of lichen sclerosis, dyspareunia (vaginal atrophy and tight introitus), and an anal fissure. She was started on steroid ointment by Dr Earlene PlaterWallace prior to her first visit with me which was already helping her vulvitis symptoms. After biopsy proven LS, patient instructed to finish a course of steroid ointment, then decrease to 2 x a week. She was started on vaginal estrogen last month as well.  She  She tried nitroglycerin for her fissure, broke out, stopped using.   GYNECOLOGIC HISTORY: Patient's last menstrual period was 07/14/2011. Contraception:Postmenopausal Menopausal hormone therapy: Estradiol cream        OB History    Gravida  3   Para  2   Term  2   Preterm      AB  1   Living  3     SAB      TAB  1   Ectopic      Multiple  1   Live Births  3              Patient Active Problem List   Diagnosis Date Noted  . History of right hip replacement 01/22/2019  . Lichen sclerosus of female genitalia 01/22/2019  . Pure hypercholesterolemia, ACVD 1.5% 01/22/2019  . History of left  AC joint reconstruction 08/17/2017    Past Medical History:  Diagnosis Date  . Anemia, as younger woman   . Osteoarthritis   . Separation of AC joint, left, s/p repair     Past Surgical History:  Procedure Laterality Date  . ACROMIO-CLAVICULAR JOINT REPAIR Left 08/10/2017   Procedure: LEFT ACROMIO-CLAVICULAR JOINT REPAIR;  Surgeon: Tarry KosXu, Naiping M, MD;  Location: [facility] OR;  Service: Orthopedics;  Laterality: Left;  . COLONOSCOPY  09/22/2014  . REFRACTIVE SURGERY Bilateral   . TOTAL HIP ARTHROPLASTY Right 04/21/2015   Procedure: RIGHT TOTAL HIP ARTHROPLASTY ANTERIOR APPROACH;  Surgeon: Tarry KosNaiping M Xu, MD;  Location: [facility] OR;  Service: Orthopedics;  Laterality: Right;    Current Outpatient Medications  Medication  Sig Dispense Refill  . acetaminophen (TYLENOL) 500 MG tablet Take 500-1,000 mg by mouth every 8 (eight) hours as needed for mild pain or moderate pain.    . Ascorbic Acid (VITAMIN C) 1000 MG tablet Take 1,000 mg by mouth daily.    . Calcium Carb-Cholecalciferol (CALCIUM 600 + D PO) Take 1 tablet by mouth daily.    . Chromium 200 MCG CAPS Take 1 capsule by mouth daily.    . clobetasol ointment (TEMOVATE) 0.05 % Apply 1 application topically at bedtime. 30 g 1  . estradiol (ESTRACE) 0.1 MG/GM vaginal cream 1 gram vaginally twice weekly 42.5 g 1  . Lidocaine-Glycerin (PREPARATION H EX) Apply topically daily as needed.    . Multiple Vitamin (MULTI-VITAMIN) tablet daily.  by Theophilus BonesErica Habersham CMA daily    . NON FORMULARY Take 1 capsule by mouth daily. Cellular health     No current facility-administered medications for this visit.      ALLERGIES: Patient has no known allergies.  Family History  Problem Relation Age of Onset  . Lung disease Mother   . Heart attack Mother   . Heart disease Mother   . Diabetes Mother   . Peripheral Artery Disease Father   . Lung cancer Father   . Melanoma Paternal Uncle   .  Cancer Paternal Grandmother     Social History   Socioeconomic History  . Marital status: Soil scientist    Spouse name: Not on file  . Number of children: 2  . Years of education: Not on file  . Highest education level: Not on file  Occupational History  . Occupation: Aeronautical engineer: [employer]  . Financial resource strain: Not on file  . Food insecurity    Worry: Not on file    Inability: Not on file  . Transportation needs    Medical: Not on file    Non-medical: Not on file  Tobacco Use  . Smoking status: Former Research scientist (life sciences)  . Smokeless tobacco: Never Used  . Tobacco comment: early 20's  Substance and Sexual Activity  . Alcohol use: Yes    Alcohol/week: 0.0 - 1.0 standard drinks  . Drug use: No  . Sexual activity: Not Currently   Lifestyle  . Physical activity    Days per week: Not on file    Minutes per session: Not on file  . Stress: Not on file  Relationships  . Social Herbalist on phone: Not on file    Gets together: Not on file    Attends religious service: Not on file    Active member of club or organization: Not on file    Attends meetings of clubs or organizations: Not on file    Relationship status: Not on file  . Intimate partner violence    Fear of current or ex partner: Not on file    Emotionally abused: Not on file    Physically abused: Not on file    Forced sexual activity: Not on file  Other Topics Concern  . Not on file  Social History Narrative   Very active. Eats healthy food choices. Takes MVM. Twin girls. Most complaints are orthopedic, from being an athlete, OR GYN.     Review of Systems  Constitutional: Negative.   HENT: Negative.   Eyes: Negative.   Respiratory: Negative.   Cardiovascular: Negative.   Gastrointestinal: Negative.   Genitourinary: Negative.   Musculoskeletal: Negative.   Skin: Negative.   Neurological: Negative.   Endo/Heme/Allergies: Negative.   Psychiatric/Behavioral: Negative.     PHYSICAL EXAMINATION:    BP 116/60 (BP Location: Right Arm, Patient Position: Sitting, Cuff Size: Normal)   Pulse 72   Temp (!) 97.2 F (36.2 C) (Temporal)   Resp 12   Wt 130 lb (59 kg)   LMP 07/14/2011   BMI 25.39 kg/m     General appearance: alert, cooperative and appears stated age  Pelvic: External genitalia:  Mild agglutination of the labia minora to majora. Focal whitening in the anterior introitus (under the clitoris) and in the posterior fourchette.               Urethra:  normal appearing urethra with no masses, tenderness or lesions              Bartholins and Skenes: normal                 Vagina: mildly atrophic vagina (improved) with normal color and discharge, no lesions. Able to insert one finger comfortably, not able to insert 2 fingers (too  tight)              Cervix: no lesions              Bimanual Exam:  Uterus:  normal  size, contour, position, consistency, mobility, non-tender              Adnexa: no mass, fullness, tenderness                Chaperone was present for exam.  ASSESSMENT Lichen sclerosis, improved Dyspareunia, hasn't attempted sexual intercourse Vaginal atrophy, some improvement with vaginal estrogen Anal fissure has healed    PLAN 1)Can use the steroid ointment 2 x a week, would apply it to the anterior and posterior introitus where the whitening is (showed patient with the mirror). If whitening improves can stop using regularly. Can use the steroid ointment daily for up to 2 weeks as needed with a flare. Recommend yearly exams, discussed increased risk of vulvar cancer in women with lichen sclerosis 2)Continue to use one gram of estrace cream vaginally and place a pea sized amount at the introitus 2 x a week 3)Discussed vaginal dilators, recommended she buy them on line.  4)Keep stools soft to prevent recurrence of anal fissure   An After Visit Summary was printed and given to the patient.

## 2019-08-15 ENCOUNTER — Ambulatory Visit: Payer: 59 | Attending: Internal Medicine

## 2019-08-15 DIAGNOSIS — Z23 Encounter for immunization: Secondary | ICD-10-CM

## 2019-08-15 NOTE — Progress Notes (Signed)
   Covid-19 Vaccination Clinic  Name:  Mary Mahoney    MRN: [id number] DOB: Feb 08, 1961  08/15/2019  [name] was observed post Covid-19 immunization for 15 minutes without incident. She was provided with Vaccine Information Sheet and instruction to access the V-Safe system.   [name] was instructed to call 911 with any severe reactions post vaccine: Marland Kitchen Difficulty breathing  . Swelling of face and throat  . A fast heartbeat  . A bad rash all over body  . Dizziness and weakness

## 2019-09-10 ENCOUNTER — Ambulatory Visit: Payer: 59 | Attending: Internal Medicine

## 2019-09-10 DIAGNOSIS — Z23 Encounter for immunization: Secondary | ICD-10-CM

## 2019-09-10 NOTE — Progress Notes (Signed)
   Covid-19 Vaccination Clinic  Name:  Mary Mahoney    MRN: [id number] DOB: 27-Jan-1961  09/10/2019  [name] was observed post Covid-19 immunization for 15 minutes without incident. She was provided with Vaccine Information Sheet and instruction to access the V-Safe system.   [name] was instructed to call 911 with any severe reactions post vaccine: Marland Kitchen Difficulty breathing  . Swelling of face and throat  . A fast heartbeat  . A bad rash all over body  . Dizziness and weakness   Immunizations Administered    Name Date Dose VIS Date Route   Pfizer COVID-19 Vaccine 09/10/2019 12:37 PM 0.3 mL 05/23/2019 Intramuscular   Manufacturer: [employer], [employer]   Lot: [id number]   NDC: [address]-7

## 2019-12-08 ENCOUNTER — Telehealth: Payer: Self-pay

## 2019-12-08 NOTE — Telephone Encounter (Signed)
Please advise if patient needs Tdap updated

## 2019-12-08 NOTE — Telephone Encounter (Signed)
Can get tdap here. Call and schedule. Thanks.
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
## 2019-12-11 NOTE — Telephone Encounter (Signed)
Patient received Tdap at pharmacy

## 2020-01-23 ENCOUNTER — Other Ambulatory Visit: Payer: Self-pay

## 2020-01-23 ENCOUNTER — Ambulatory Visit (INDEPENDENT_AMBULATORY_CARE_PROVIDER_SITE_OTHER): Payer: 59 | Admitting: Family Medicine

## 2020-01-23 ENCOUNTER — Encounter: Payer: Self-pay | Admitting: Family Medicine

## 2020-01-23 VITALS — BP 102/70 | HR 52 | Temp 97.9°F | Ht 60.0 in | Wt 121.4 lb

## 2020-01-23 DIAGNOSIS — Z23 Encounter for immunization: Secondary | ICD-10-CM

## 2020-01-23 DIAGNOSIS — Z0000 Encounter for general adult medical examination without abnormal findings: Secondary | ICD-10-CM

## 2020-01-23 LAB — CBC WITH DIFFERENTIAL/PLATELET
Absolute Monocytes: 424 cells/uL (ref 200–950)
Basophils Absolute: 61 cells/uL (ref 0–200)
Basophils Relative: 1.1 %
Eosinophils Absolute: 88 cells/uL (ref 15–500)
Eosinophils Relative: 1.6 %
HCT: 40.8 % (ref 35.0–45.0)
Hemoglobin: 13.5 g/dL (ref 11.7–15.5)
Lymphs Abs: 2519 cells/uL (ref 850–3900)
MCH: 30.8 pg (ref 27.0–33.0)
MCHC: 33.1 g/dL (ref 32.0–36.0)
MCV: 92.9 fL (ref 80.0–100.0)
MPV: 12.3 fL (ref 7.5–12.5)
Monocytes Relative: 7.7 %
Neutro Abs: 2409 cells/uL (ref 1500–7800)
Neutrophils Relative %: 43.8 %
Platelets: 219 10*3/uL (ref 140–400)
RBC: 4.39 10*6/uL (ref 3.80–5.10)
RDW: 12.2 % (ref 11.0–15.0)
Total Lymphocyte: 45.8 %
WBC: 5.5 10*3/uL (ref 3.8–10.8)

## 2020-01-23 LAB — LIPID PANEL
Cholesterol: 200 mg/dL — ABNORMAL HIGH (ref <200)
HDL: 65 mg/dL (ref >=50)
LDL Cholesterol (Calc): 119 mg/dL (calc) — ABNORMAL HIGH
Non-HDL Cholesterol (Calc): 135 mg/dL (calc) — ABNORMAL HIGH (ref <130)
Total CHOL/HDL Ratio: 3.1 (calc) (ref <5.0)
Triglycerides: 67 mg/dL (ref <150)

## 2020-01-23 LAB — COMPREHENSIVE METABOLIC PANEL
AG Ratio: 2.6 (calc) — ABNORMAL HIGH (ref 1.0–2.5)
ALT: 23 U/L (ref 6–29)
AST: 20 U/L (ref 10–35)
Albumin: 4.5 g/dL (ref 3.6–5.1)
Alkaline phosphatase (APISO): 57 U/L (ref 37–153)
BUN: 22 mg/dL (ref 7–25)
CO2: 30 mmol/L (ref 20–32)
Calcium: 10.1 mg/dL (ref 8.6–10.4)
Chloride: 105 mmol/L (ref 98–110)
Creat: 0.86 mg/dL (ref 0.50–1.05)
Globulin: 1.7 g/dL (calc) — ABNORMAL LOW (ref 1.9–3.7)
Glucose, Bld: 86 mg/dL (ref 65–99)
Potassium: 4.7 mmol/L (ref 3.5–5.3)
Sodium: 141 mmol/L (ref 135–146)
Total Bilirubin: 0.6 mg/dL (ref 0.2–1.2)
Total Protein: 6.2 g/dL (ref 6.1–8.1)

## 2020-01-23 NOTE — Addendum Note (Signed)
Addended by: Laddie Aquas A on: 01/23/2020 09:06 AM   Modules accepted: Orders

## 2020-01-23 NOTE — Progress Notes (Signed)
Subjective  Chief Complaint  Patient presents with   Annual Exam    fasting   Transitions Of Care    HPI: Mary Mahoney is a 59 y.o. female who presents to [facility] today for a Female Wellness Visit. She also has the concerns and/or needs as listed above in the chief complaint. These will be addressed in addition to the Health Maintenance Visit.   Wellness Visit: annual visit with health maintenance review and exam without Pap   HM: up to date female wellness per Dr. Oscar La. Happy healthy lifestyle. Due shingrix  Assessment  1. Annual physical exam      Plan  Female Wellness Visit:  Age appropriate Health Maintenance and Prevention measures were discussed with patient. Included topics are cancer screening recommendations, ways to keep healthy (see AVS) including dietary and exercise recommendations, regular eye and dental care, use of seat belts, and avoidance of moderate alcohol use and tobacco use.   BMI: discussed patient's BMI and encouraged positive lifestyle modifications to help get to or maintain a target BMI.  HM needs and immunizations were addressed and ordered. See below for orders. See HM and immunization section for updates. shingrix #1 today  Routine labs and screening tests ordered including cmp, cbc and lipids where appropriate.  Discussed recommendations regarding Vit D and calcium supplementation (see AVS)  Follow up: No follow-ups on file.  Orders Placed This Encounter  Procedures   CBC with Differential/Platelet   Comprehensive metabolic panel   Lipid panel   No orders of the defined types were placed in this encounter.     Lifestyle: Body mass index is 23.71 kg/m. Wt Readings from Last 3 Encounters:  01/23/20 121 lb 6.4 oz (55.1 kg)  02/26/19 130 lb (59 kg)  01/24/19 130 lb (59 kg)   Diet: low fat Exercise: frequently, running/ jogging  Patient Active Problem List   Diagnosis Date Noted   History  of right hip replacement 01/22/2019   Lichen sclerosus of female genitalia 01/22/2019   History of left  AC joint reconstruction 08/17/2017   Health Maintenance  Topic Date Due   INFLUENZA VACCINE  01/11/2020   MAMMOGRAM  02/13/2020   PAP SMEAR-Modifier  10/10/2022   COLONOSCOPY  09/21/2024   TETANUS/TDAP  12/07/2029   COVID-19 Vaccine  Completed   Hepatitis C Screening  Completed   HIV Screening  Completed   Immunization History  Administered Date(s) Administered   Influenza-Unspecified 04/13/2007   PFIZER SARS-COV-2 Vaccination 08/15/2019, 09/10/2019   Td 01/06/1998   Tdap 04/02/2012, 12/08/2019   Zoster Recombinat (Shingrix) 04/14/2013   We updated and reviewed the patient's past history in detail and it is documented below. Allergies: Patient has No Known Allergies. Past Medical History Patient  has a past medical history of Anemia, as younger woman, Osteoarthritis, and Separation of AC joint, left, s/p repair. Past Surgical History Patient  has a past surgical history that includes Colonoscopy (09/22/2014); Total hip arthroplasty (Right, 04/21/2015); Acromio-clavicular joint repair (Left, 08/10/2017); and Refractive surgery (Bilateral). Family History: Patient family history includes Cancer in her paternal grandmother; Diabetes in her mother; Heart attack in her mother; Heart disease in her mother; Lung cancer in her father; Lung disease in her mother; Melanoma in her paternal uncle; Peripheral Artery Disease in her father. Social History:  Patient  reports that she has quit smoking. She has never used smokeless tobacco. She reports current alcohol use. She reports that she does not use drugs.  Review of Systems: Constitutional: negative for fever or malaise Ophthalmic: negative for photophobia, double vision or loss of vision Cardiovascular: negative for chest pain, dyspnea on exertion, or new LE swelling Respiratory: negative for SOB or persistent  cough Gastrointestinal: negative for abdominal pain, change in bowel habits or melena Genitourinary: negative for dysuria or gross hematuria, no abnormal uterine bleeding or disharge Musculoskeletal: negative for new gait disturbance or muscular weakness Integumentary: negative for new or persistent rashes, no breast lumps Neurological: negative for TIA or stroke symptoms Psychiatric: negative for SI or delusions Allergic/Immunologic: negative for hives  Patient Care Team    Relationship Specialty Notifications Start End  Willow Ora, MD PCP - General Family Medicine  01/23/20   Romualdo Bolk, MD Consulting Physician Obstetrics and Gynecology  01/23/20     Objective  Vitals: BP 102/70    Pulse (!) 52    Temp 97.9 F (36.6 C) (Temporal)    Ht 5' (1.524 m)    Wt 121 lb 6.4 oz (55.1 kg)    LMP 07/14/2011    SpO2 98%    BMI 23.71 kg/m  General:  Well developed, well nourished, no acute distress  Psych:  Alert and orientedx3,normal mood and affect HEENT:  Normocephalic, atraumatic, non-icteric sclera,  supple neck without adenopathy, mass or thyromegaly Cardiovascular:  Normal S1, S2, RRR without gallop, rub or murmur Respiratory:  Good breath sounds bilaterally, CTAB with normal respiratory effort Gastrointestinal: normal bowel sounds, soft, non-tender, no noted masses. No HSM MSK: no deformities, contusions. Joints are without erythema or swelling.  Skin:  Warm, no rashes or suspicious lesions noted Neurologic:    Mental status is normal. CN 2-11 are normal. Gross motor and sensory exams are normal. Normal gait. No tremor    Commons side effects, risks, benefits, and alternatives for medications and treatment plan prescribed today were discussed, and the patient expressed understanding of the given instructions. Patient is instructed to call or message via MyChart if he/she has any questions or concerns regarding our treatment plan. No barriers to understanding were identified.  We discussed Red Flag symptoms and signs in detail. Patient expressed understanding regarding what to do in case of urgent or emergency type symptoms.   Medication list was reconciled, printed and provided to the patient in AVS. Patient instructions and summary information was reviewed with the patient as documented in the AVS. This note was prepared with assistance of Dragon voice recognition software. Occasional wrong-word or sound-a-like substitutions may have occurred due to the inherent limitations of voice recognition software  This visit occurred during the SARS-CoV-2 public health emergency.  Safety protocols were in place, including screening questions prior to the visit, additional usage of staff PPE, and extensive cleaning of exam room while observing appropriate contact time as indicated for disinfecting solutions.

## 2020-01-23 NOTE — Patient Instructions (Addendum)
Please return in 12 months for your annual complete physical; please come fasting.  I will release your lab results to you on your MyChart account with further instructions. Please reply with any questions.   Today you were given your 1 of 2 Shingrix vaccination. Please return in 6 months for your second. Schedule a nurse visit for that.   It was a pleasure meeting you today! Thank you for choosing [place] to meet your healthcare needs! I truly look forward to working with you. If you have any questions or concerns, please send me a message via Mychart or call the office at [phone].   Preventive Care 94-91 Years Old, Female Preventive care refers to visits with your health care provider and lifestyle choices that can promote health and wellness. This includes:  A yearly physical exam. This may also be called an annual well check.  Regular dental visits and eye exams.  Immunizations.  Screening for certain conditions.  Healthy lifestyle choices, such as eating a healthy diet, getting regular exercise, not using drugs or products that contain nicotine and tobacco, and limiting alcohol use. What can I expect for my preventive care visit? Physical exam Your health care provider will check your:  Height and weight. This may be used to calculate body mass index (BMI), which tells if you are at a healthy weight.  Heart rate and blood pressure.  Skin for abnormal spots. Counseling Your health care provider may ask you questions about your:  Alcohol, tobacco, and drug use.  Emotional well-being.  Home and relationship well-being.  Sexual activity.  Eating habits.  Work and work Statistician.  Method of birth control.  Menstrual cycle.  Pregnancy history. What immunizations do I need?  Influenza (flu) vaccine  This is recommended every year. Tetanus, diphtheria, and pertussis (Tdap) vaccine  You may need a Td booster every 10 years. Varicella (chickenpox) vaccine  You  may need this if you have not been vaccinated. Zoster (shingles) vaccine  You may need this after age 3. Measles, mumps, and rubella (MMR) vaccine  You may need at least one dose of MMR if you were born in 1957 or later. You may also need a second dose. Pneumococcal conjugate (PCV13) vaccine  You may need this if you have certain conditions and were not previously vaccinated. Pneumococcal polysaccharide (PPSV23) vaccine  You may need one or two doses if you smoke cigarettes or if you have certain conditions. Meningococcal conjugate (MenACWY) vaccine  You may need this if you have certain conditions. Hepatitis A vaccine  You may need this if you have certain conditions or if you travel or work in places where you may be exposed to hepatitis A. Hepatitis B vaccine  You may need this if you have certain conditions or if you travel or work in places where you may be exposed to hepatitis B. Haemophilus influenzae type b (Hib) vaccine  You may need this if you have certain conditions. Human papillomavirus (HPV) vaccine  If recommended by your health care provider, you may need three doses over 6 months. You may receive vaccines as individual doses or as more than one vaccine together in one shot (combination vaccines). Talk with your health care provider about the risks and benefits of combination vaccines. What tests do I need? Blood tests  Lipid and cholesterol levels. These may be checked every 5 years, or more frequently if you are over 12 years old.  Hepatitis C test.  Hepatitis B test. Screening  Lung  cancer screening. You may have this screening every year starting at age 20 if you have a 30-pack-year history of smoking and currently smoke or have quit within the past 15 years.  Colorectal cancer screening. All adults should have this screening starting at age 37 and continuing until age 68. Your health care provider may recommend screening at age 95 if you are at increased  risk. You will have tests every 1-10 years, depending on your results and the type of screening test.  Diabetes screening. This is done by checking your blood sugar (glucose) after you have not eaten for a while (fasting). You may have this done every 1-3 years.  Mammogram. This may be done every 1-2 years. Talk with your health care provider about when you should start having regular mammograms. This may depend on whether you have a family history of breast cancer.  BRCA-related cancer screening. This may be done if you have a family history of breast, ovarian, tubal, or peritoneal cancers.  Pelvic exam and Pap test. This may be done every 3 years starting at age 41. Starting at age 17, this may be done every 5 years if you have a Pap test in combination with an HPV test. Other tests  Sexually transmitted disease (STD) testing.  Bone density scan. This is done to screen for osteoporosis. You may have this scan if you are at high risk for osteoporosis. Follow these instructions at home: Eating and drinking  Eat a diet that includes fresh fruits and vegetables, whole grains, lean protein, and low-fat dairy.  Take vitamin and mineral supplements as recommended by your health care provider.  Do not drink alcohol if: ? Your health care provider tells you not to drink. ? You are pregnant, may be pregnant, or are planning to become pregnant.  If you drink alcohol: ? Limit how much you have to 0-1 drink a day. ? Be aware of how much alcohol is in your drink. In the [place] one drink equals one 12 oz bottle of beer (355 mL), one 5 oz glass of wine (148 mL), or one 1 oz glass of hard liquor (44 mL). Lifestyle  Take daily care of your teeth and gums.  Stay active. Exercise for at least 30 minutes on 5 or more days each week.  Do not use any products that contain nicotine or tobacco, such as cigarettes, e-cigarettes, and chewing tobacco. If you need help quitting, ask your health care  provider.  If you are sexually active, practice safe sex. Use a condom or other form of birth control (contraception) in order to prevent pregnancy and STIs (sexually transmitted infections).  If told by your health care provider, take low-dose aspirin daily starting at age 79. What's next?  Visit your health care provider once a year for a well check visit.  Ask your health care provider how often you should have your eyes and teeth checked.  Stay up to date on all vaccines. This information is not intended to replace advice given to you by your health care provider. Make sure you discuss any questions you have with your health care provider. Document Revised: 02/07/2018 Document Reviewed: 02/07/2018 Elsevier Patient Education  2020 [employer].

## 2020-04-16 ENCOUNTER — Ambulatory Visit: Payer: Self-pay

## 2020-04-16 ENCOUNTER — Other Ambulatory Visit: Payer: Self-pay

## 2020-04-16 ENCOUNTER — Encounter: Payer: Self-pay | Admitting: Orthopaedic Surgery

## 2020-04-16 ENCOUNTER — Ambulatory Visit (INDEPENDENT_AMBULATORY_CARE_PROVIDER_SITE_OTHER): Payer: 59 | Admitting: Orthopaedic Surgery

## 2020-04-16 DIAGNOSIS — Z96641 Presence of right artificial hip joint: Secondary | ICD-10-CM

## 2020-04-16 NOTE — Progress Notes (Signed)
Office Visit Note   Patient: Mary Mahoney           Date of Birth: Oct 23, 1960           MRN: [id number] Visit Date: 04/16/2020              Requested by: Helane Rima, DO [address] [place],  [place] [address] PCP: Willow Ora, MD   Assessment & Plan: Visit Diagnoses:  1. Status post total replacement of right hip     Plan: Impression is 5 years status post right total hip replacement.  She has done well from the surgery and can follow-up with [place] as needed.  She has obviously been able to be very active since she has had a hip replacement.  She is very happy with things.  We will see her back as needed.  Follow-Up Instructions: Return if symptoms worsen or fail to improve.   Orders:  Orders Placed This Encounter  Procedures  . XR HIP UNILAT W OR W/O PELVIS 2-3 VIEWS RIGHT   No orders of the defined types were placed in this encounter.     Procedures: No procedures performed   Clinical Data: No additional findings.   Subjective: Chief Complaint  Patient presents with  . Right Hip - Pain    Junior is 5-year status post right total hip replacement.  She is doing great and reports no pain.  She has forgotten about the fact that she has had hip replacement.  She was able to run a half marathon this past weekend without any trouble.  Her left AC joint reconstruction has done well as well.   Review of Systems   Objective: Vital Signs: LMP 07/14/2011   Physical Exam  Ortho Exam Right hip shows fully healed surgical scar.  Painless range of motion.  Leg lengths are equal.  Normal gait. Specialty Comments:  No specialty comments available.  Imaging: XR HIP UNILAT W OR W/O PELVIS 2-3 VIEWS RIGHT  Result Date: 04/16/2020 Stable right total hip replacement without complication    PMFS History: Patient Active Problem List   Diagnosis Date Noted  . Status post total replacement of right hip 04/16/2020  . History of right hip  replacement 01/22/2019  . Lichen sclerosus of female genitalia 01/22/2019  . History of left  AC joint reconstruction 08/17/2017   Past Medical History:  Diagnosis Date  . Anemia, as younger woman   . Osteoarthritis   . Separation of AC joint, left, s/p repair     Family History  Problem Relation Age of Onset  . Lung disease Mother   . Heart attack Mother   . Heart disease Mother   . Diabetes Mother   . Peripheral Artery Disease Father   . Lung cancer Father   . Melanoma Paternal Uncle   . Cancer Paternal Grandmother     Past Surgical History:  Procedure Laterality Date  . ACROMIO-CLAVICULAR JOINT REPAIR Left 08/10/2017   Procedure: LEFT ACROMIO-CLAVICULAR JOINT REPAIR;  Surgeon: Tarry Kos, MD;  Location: [facility] OR;  Service: Orthopedics;  Laterality: Left;  . COLONOSCOPY  09/22/2014  . REFRACTIVE SURGERY Bilateral   . TOTAL HIP ARTHROPLASTY Right 04/21/2015   Procedure: RIGHT TOTAL HIP ARTHROPLASTY ANTERIOR APPROACH;  Surgeon: Tarry Kos, MD;  Location: [facility] OR;  Service: Orthopedics;  Laterality: Right;   Social History   Occupational History  . Occupation: Agricultural engineer: [employer]  Tobacco Use  .  Smoking status: Former Games developer  . Smokeless tobacco: Never Used  . Tobacco comment: early 20's  Vaping Use  . Vaping Use: Never used  Substance and Sexual Activity  . Alcohol use: Yes    Alcohol/week: 0.0 - 1.0 standard drinks  . Drug use: No  . Sexual activity: Not Currently

## 2020-04-19 ENCOUNTER — Ambulatory Visit (INDEPENDENT_AMBULATORY_CARE_PROVIDER_SITE_OTHER): Payer: 59 | Admitting: Orthopaedic Surgery

## 2020-05-17 ENCOUNTER — Ambulatory Visit: Payer: 59

## 2020-07-06 ENCOUNTER — Ambulatory Visit (INDEPENDENT_AMBULATORY_CARE_PROVIDER_SITE_OTHER): Payer: BC Managed Care – PPO | Admitting: Obstetrics and Gynecology

## 2020-07-06 ENCOUNTER — Other Ambulatory Visit: Payer: Self-pay

## 2020-07-06 ENCOUNTER — Encounter: Payer: Self-pay | Admitting: Obstetrics and Gynecology

## 2020-07-06 VITALS — BP 120/72 | HR 64 | Ht 59.75 in | Wt 125.0 lb

## 2020-07-06 DIAGNOSIS — N952 Postmenopausal atrophic vaginitis: Secondary | ICD-10-CM | POA: Diagnosis not present

## 2020-07-06 DIAGNOSIS — Z01419 Encounter for gynecological examination (general) (routine) without abnormal findings: Secondary | ICD-10-CM | POA: Diagnosis not present

## 2020-07-06 DIAGNOSIS — N941 Unspecified dyspareunia: Secondary | ICD-10-CM

## 2020-07-06 DIAGNOSIS — K5900 Constipation, unspecified: Secondary | ICD-10-CM

## 2020-07-06 DIAGNOSIS — N904 Leukoplakia of vulva: Secondary | ICD-10-CM | POA: Diagnosis not present

## 2020-07-06 MED ORDER — CLOBETASOL PROPIONATE 0.05 % EX OINT: TOPICAL_OINTMENT | CUTANEOUS | 1 refills | Status: DC

## 2020-07-06 MED ORDER — ESTRADIOL 0.1 MG/GM VA CREA
TOPICAL_CREAM | VAGINAL | 1 refills | Status: DC
Start: 2020-07-06 — End: 2022-07-11

## 2020-07-06 NOTE — Patient Instructions (Addendum)
EXERCISE   We recommended that you start or continue a regular exercise program for good health. Physical activity is anything that gets your body moving, some is better than none. The CDC recommends 150 minutes per week of Moderate-Intensity Aerobic Activity and 2 or more days of Muscle Strengthening Activity.  Benefits of exercise are limitless: helps weight loss/weight maintenance, improves mood and energy, helps with depression and anxiety, improves sleep, tones and strengthens muscles, improves balance, improves bone density, protects from chronic conditions such as heart disease, high blood pressure and diabetes and so much more. To learn more visit: https://www.cdc.gov/physicalactivity/index.html  DIET: Good nutrition starts with a healthy diet of fruits, vegetables, whole grains, and lean protein sources. Drink plenty of water for hydration. Minimize empty calories, sodium, sweets. For more information about dietary recommendations visit: https://health.gov/our-work/nutrition-physical-activity/dietary-guidelines and https://www.myplate.gov/  ALCOHOL:  Women should limit their alcohol intake to no more than 7 drinks/beers/glasses of wine (combined, not each!) per week. Moderation of alcohol intake to this level decreases your risk of breast cancer and liver damage.  If you are concerned that you may have a problem, or your friends have told you they are concerned about your drinking, there are many resources to help. A well-known program that is free, effective, and available to all people all over the nation is Alcoholics Anonymous.  Check out this site to learn more: https://www.aa.org/   CALCIUM AND VITAMIN D:  Adequate intake of calcium and Vitamin D are recommended for bone health.  You should be getting between 1000-1200 mg of calcium and 800 units of Vitamin D daily between diet and supplements  PAP SMEARS:  Pap smears, to check for cervical cancer or precancers,  have traditionally been  done yearly, scientific advances have shown that most women can have pap smears less often.  However, every woman still should have a physical exam from her gynecologist every year. It will include a breast check, inspection of the vulva and vagina to check for abnormal growths or skin changes, a visual exam of the cervix, and then an exam to evaluate the size and shape of the uterus and ovaries. We will also provide age appropriate advice regarding health maintenance, like when you should have certain vaccines, screening for sexually transmitted diseases, bone density testing, colonoscopy, mammograms, etc.   MAMMOGRAMS:  All women over 40 years old should have a routine mammogram.   COLON CANCER SCREENING: Now recommend starting at age 45. At this time colonoscopy is not covered for routine screening until 50. There are take home tests that can be done between 45-49.   COLONOSCOPY:  Colonoscopy to screen for colon cancer is recommended for all women at age 50.  We know, you hate the idea of the prep.  We agree, BUT, having colon cancer and not knowing it is worse!!  Colon cancer so often starts as a polyp that can be seen and removed at colonscopy, which can quite literally save your life!  And if your first colonoscopy is normal and you have no family history of colon cancer, most women don't have to have it again for 10 years.  Once every ten years, you can do something that may end up saving your life, right?  We will be happy to help you get it scheduled when you are ready.  Be sure to check your insurance coverage so you understand how much it will cost.  It may be covered as a preventative service at no cost, but you should check   your particular policy.      Breast Self-Awareness Breast self-awareness means being familiar with how your breasts look and feel. It involves checking your breasts regularly and reporting any changes to your health care provider. Practicing breast self-awareness is  important. A change in your breasts can be a sign of a serious medical problem. Being familiar with how your breasts look and feel allows you to find any problems early, when treatment is more likely to be successful. All women should practice breast self-awareness, including women who have had breast implants. How to do a breast self-exam One way to learn what is normal for your breasts and whether your breasts are changing is to do a breast self-exam. To do a breast self-exam: Look for Changes  1. Remove all the clothing above your waist. 2. Stand in front of a mirror in a room with good lighting. 3. Put your hands on your hips. 4. Push your hands firmly downward. 5. Compare your breasts in the mirror. Look for differences between them (asymmetry), such as: ? Differences in shape. ? Differences in size. ? Puckers, dips, and bumps in one breast and not the other. 6. Look at each breast for changes in your skin, such as: ? Redness. ? Scaly areas. 7. Look for changes in your nipples, such as: ? Discharge. ? Bleeding. ? Dimpling. ? Redness. ? A change in position. Feel for Changes Carefully feel your breasts for lumps and changes. It is best to do this while lying on your back on the floor and again while sitting or standing in the shower or tub with soapy water on your skin. Feel each breast in the following way:  Place the arm on the side of the breast you are examining above your head.  Feel your breast with the other hand.  Start in the nipple area and make  inch (2 cm) overlapping circles to feel your breast. Use the pads of your three middle fingers to do this. Apply light pressure, then medium pressure, then firm pressure. The light pressure will allow you to feel the tissue closest to the skin. The medium pressure will allow you to feel the tissue that is a little deeper. The firm pressure will allow you to feel the tissue close to the ribs.  Continue the overlapping circles,  moving downward over the breast until you feel your ribs below your breast.  Move one finger-width toward the center of the body. Continue to use the  inch (2 cm) overlapping circles to feel your breast as you move slowly up toward your collarbone.  Continue the up and down exam using all three pressures until you reach your armpit.  Write Down What You Find  Write down what is normal for each breast and any changes that you find. Keep a written record with breast changes or normal findings for each breast. By writing this information down, you do not need to depend only on memory for size, tenderness, or location. Write down where you are in your menstrual cycle, if you are still menstruating. If you are having trouble noticing differences in your breasts, do not get discouraged. With time you will become more familiar with the variations in your breasts and more comfortable with the exam. How often should I examine my breasts? Examine your breasts every month. If you are breastfeeding, the best time to examine your breasts is after a feeding or after using a breast pump. If you menstruate, the best time to   examine your breasts is 5-7 days after your period is over. During your period, your breasts are lumpier, and it may be more difficult to notice changes. When should I see my health care provider? See your health care provider if you notice:  A change in shape or size of your breasts or nipples.  A change in the skin of your breast or nipples, such as a reddened or scaly area.  Unusual discharge from your nipples.  A lump or thick area that was not there before.  Pain in your breasts.  Anything that concerns you. About Constipation  Constipation Overview Constipation is the most common gastrointestinal complaint -- about 4 million Americans experience constipation and make 2.5 million physician visits a year to get help for the problem.  Constipation can occur when the colon absorbs  too much water, the colon's muscle contraction is slow or sluggish, and/or there is delayed transit time through the colon.  The result is stool that is hard and dry.  Indicators of constipation include straining during bowel movements greater than 25% of the time, having fewer than three bowel movements per week, and/or the feeling of incomplete evacuation.  There are established guidelines ([place] II ) for defining constipation. A person needs to have two or more of the following symptoms for at least 12 weeks (not necessarily consecutive) in the preceding 12 months: . Straining in  greater than 25% of bowel movements . Lumpy or hard stools in greater than 25% of bowel movements . Sensation of incomplete emptying in greater than 25% of bowel movements . Sensation of anorectal obstruction/blockade in greater than 25% of bowel movements . Manual maneuvers to help empty greater than 25% of bowel movements (e.g., digital evacuation, support of the pelvic floor)  . Less than  3 bowel movements/week . Loose stools are not present, and criteria for irritable bowel syndrome are insufficient  Common Causes of Constipation . Lack of fiber in your diet . Lack of physical activity . Medications, including iron and calcium supplements  . Dairy intake . Dehydration . Abuse of laxatives  Travel  Irritable Bowel Syndrome  Pregnancy  Luteal phase of menstruation (after ovulation and before menses)  Colorectal problems  Intestinal Dysfunction  Treating Constipation  There are several ways of treating constipation, including changes to diet and exercise, use of laxatives, adjustments to the pelvic floor, and scheduled toileting.  These treatments include: . increasing fiber and fluids in the diet  . increasing physical activity . learning muscle coordination   learning proper toileting techniques and toileting modifications   designing and sticking  to a toileting schedule     2007,  [employer] Doc.22  Try 500 mg a day of magnesium for constipation.

## 2020-07-06 NOTE — Progress Notes (Signed)
60 y.o. [id number] Domestic Partner White or Caucasian Not Hispanic or Latino female here for annual exam. Patient would like a refill of Clobetasol ointment for Lichen Sclerosus.   The patient was diagnosed with lichen sclerosis in 2020. She was using steroid ointment with flare ups which helped. She ran out of the steroid and is having a mild flare.  She was started on vaginal estrogen in 2020. She is out of this 6 months ago. She bought dilators back in 2020, didn't use them regularly. Hasn't been able to have penetration for several years.   No vaginal bleeding.  She struggles with constipation. Has had anal fissures in the past, as well as hemorrhoids.     Patient's last menstrual period was 07/14/2011.          Sexually active: No.  The current method of family planning is post menopausal status.    Exercising: Yes.    gym and running Smoker:  no  Health Maintenance: Pap:  10/09/17 Neg:Neg HR HPV  07/31/14 Neg:Neg HR HPV History of abnormal Pap:  no MMG:  02/13/19 BIRADS 0:Incomplete -- patient to schedule BMD:   never Colonoscopy: 09/22/14 f/u 10 years TDaP:  2013 Gardasil: n/a   reports that she has quit smoking. She has never used smokeless tobacco. She reports current alcohol use. She reports that she does not use drugs.  Just occasional ETOH. 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 She has identical twins, 51 (female and transgender female). Has an adult son, no grandchildren.   Past Medical History:  Diagnosis Date  . Anemia, as younger woman   . Osteoarthritis   . Separation of AC joint, left, s/p repair     Past Surgical History:  Procedure Laterality Date  . ACROMIO-CLAVICULAR JOINT REPAIR Left 08/10/2017   Procedure: LEFT ACROMIO-CLAVICULAR JOINT REPAIR;  Surgeon: Tarry Kos, MD;  Location: [facility] OR;  Service: Orthopedics;  Laterality: Left;  . COLONOSCOPY  09/22/2014  . REFRACTIVE SURGERY Bilateral   . TOTAL HIP ARTHROPLASTY Right 04/21/2015    Procedure: RIGHT TOTAL HIP ARTHROPLASTY ANTERIOR APPROACH;  Surgeon: Tarry Kos, MD;  Location: [facility] OR;  Service: Orthopedics;  Laterality: Right;    Current Outpatient Medications  Medication Sig Dispense Refill  . acetaminophen (TYLENOL) 500 MG tablet Take 500-1,000 mg by mouth every 8 (eight) hours as needed for mild pain or moderate pain.    . Ascorbic Acid (VITAMIN C) 1000 MG tablet Take 1,000 mg by mouth daily.    . Calcium Carb-Cholecalciferol (CALCIUM 600 + D PO) Take 1 tablet by mouth daily.    . Chromium 200 MCG CAPS Take 1 capsule by mouth daily.    . clobetasol ointment (TEMOVATE) 0.05 % Apply 1 application topically at bedtime.    Marland Kitchen estradiol (ESTRACE) 0.1 MG/GM vaginal cream 1 gram vaginally twice weekly 42.5 g 1  . Lidocaine-Glycerin (PREPARATION H EX) Apply topically daily as needed.    . Multiple Vitamin (MULTI-VITAMIN) tablet daily.  by Theophilus Bones CMA daily    . naproxen sodium (ALEVE) 220 MG tablet Take 220 mg by mouth as needed.     No current facility-administered medications for this visit.    Family History  Problem Relation Age of Onset  . Lung disease Mother   . Heart attack Mother   . Heart disease Mother   . Diabetes Mother   . Peripheral Artery Disease Father   . Lung cancer Father   . Melanoma Paternal Uncle   .  Cancer Paternal Grandmother     Review of Systems  Constitutional: Negative.   HENT: Negative.   Eyes: Negative.   Respiratory: Negative.   Cardiovascular: Negative.   Gastrointestinal: Negative.   Endocrine: Negative.   Genitourinary: Negative.   Musculoskeletal: Negative.   Skin: Negative.   Allergic/Immunologic: Negative.   Neurological: Negative.   Hematological: Negative.   Psychiatric/Behavioral: Negative.     Exam:   BP 120/72 (BP Location: Left Arm, Patient Position: Sitting, Cuff Size: Normal)   Pulse 64   Ht 4' 11.75" (1.518 m)   Wt 125 lb (56.7 kg)   LMP 07/14/2011   BMI 24.62 kg/m   Weight change:  @WEIGHTCHANGE @ Height:   Height: 4' 11.75" (151.8 cm)  Ht Readings from Last 3 Encounters:  07/06/20 4' 11.75" (1.518 m)  01/23/20 5' (1.524 m)  01/24/19 5' (1.524 m)    General appearance: alert, cooperative and appears stated age Head: Normocephalic, without obvious abnormality, atraumatic Neck: no adenopathy, supple, symmetrical, trachea midline and thyroid normal to inspection and palpation Lungs: clear to auscultation bilaterally Cardiovascular: regular rate and rhythm Breasts: normal appearance, no masses or tenderness Abdomen: soft, non-tender; non distended,  no masses,  no organomegaly Extremities: extremities normal, atraumatic, no cyanosis or edema Skin: Skin color, texture, turgor normal. No rashes or lesions Lymph nodes: Cervical, supraclavicular, and axillary nodes normal. No abnormal inguinal nodes palpated Neurologic: Grossly normal   Pelvic: External genitalia:  no lesions, minimal whitening on the perineum, no significant agglutination, no fissures. There is a scar at 6 o'clock at the introitus/perineum from prior vaginal delivery              Urethra:  normal appearing urethra with no masses, tenderness or lesions              Bartholins and Skenes: normal                 Vagina: atrophic appearing vagina, able to insert 2 fingers part way prior to discomfort              Cervix: no lesions               Bimanual Exam:  Uterus:  normal size, contour, position, consistency, mobility, non-tender              Adnexa: no mass, fullness, tenderness               Rectovaginal: Confirms               Anus:  normal sphincter tone, no lesions  11-[birth year]1992 chaperoned for the exam.  1. Well woman exam Discussed breast self exam Discussed calcium and vit D intake Labs with primary (recommended she get a vit d at her next visit, was low in 2020) Mammogram due, she will schedule Colonoscopy UTD   2. Vaginal atrophy  - estradiol (ESTRACE) 0.1 MG/GM vaginal cream; 1  gram vaginally qhs x 1 week, then change to twice weekly  Dispense: 42.5 g; Refill: 1 -Recommended she use the vaginal dilators regularly in order to be sexually active  3. Dyspareunia, female Restarting vaginal estrogen and use of vaginal dilators When sexually active, use lubrication and she should control the rate and depth of penetration   4. Lichen sclerosus of female genitalia  - clobetasol ointment (TEMOVATE) 0.05 %; Use a pea sized amount BID for up to 2 weeks as needed. Not for daily long term use.  Dispense: 30 g; Refill: 1  5. Constipation, unspecified constipation type Information on constipation given Recommended she try magnesium supplements

## 2020-07-16 DIAGNOSIS — M79672 Pain in left foot: Secondary | ICD-10-CM | POA: Diagnosis not present

## 2020-07-27 ENCOUNTER — Ambulatory Visit: Payer: 59

## 2020-11-10 ENCOUNTER — Encounter: Payer: Self-pay | Admitting: Registered Nurse

## 2020-11-10 ENCOUNTER — Telehealth (INDEPENDENT_AMBULATORY_CARE_PROVIDER_SITE_OTHER): Payer: BC Managed Care – PPO | Admitting: Registered Nurse

## 2020-11-10 ENCOUNTER — Other Ambulatory Visit: Payer: Self-pay

## 2020-11-10 VITALS — Temp 97.8°F

## 2020-11-10 DIAGNOSIS — U071 COVID-19: Secondary | ICD-10-CM

## 2020-11-10 MED ORDER — BENZONATATE 200 MG PO CAPS: 200.0000 mg | ORAL_CAPSULE | Freq: Two times a day (BID) | ORAL | 0 refills | Status: DC | PRN

## 2020-11-10 MED ORDER — MOLNUPIRAVIR EUA 200MG CAPSULE: 4.0000 | ORAL_CAPSULE | Freq: Two times a day (BID) | ORAL | 0 refills | Status: AC

## 2020-11-10 NOTE — Progress Notes (Signed)
Telemedicine Encounter- SOAP NOTE Established Patient  This telephone encounter was conducted with the patient's (or proxy's) verbal consent via audio telecommunications: yes  Patient was instructed to have this encounter in a suitably private space; and to only have persons present to whom they give permission to participate. In addition, patient identity was confirmed by use of name plus two identifiers (DOB and address).  I discussed the limitations, risks, security and privacy concerns of performing an evaluation and management service by telephone and the availability of in person appointments. I also discussed with the patient that there may be a patient responsible charge related to this service. The patient expressed understanding and agreed to proceed.  I spent a total of 14 minutes talking with the patient or their proxy.  Chief Complaint  Patient presents with   Covid Positive    Patient states she tested positive  Body  aches, headaches , sore throat, and cough     Subjective   Mary Mahoney is a 60 y.o. established patient. Telephone visit today for covid  HPI Positive last night and this morning Symptoms onset 2-3 days ago Body aches, headache (frontal) sinus pressure, sore throat, cough No shob, doe, chest pain, nvd.   Vaccinated x 2, no booster.  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     Ascorbic Acid (VITAMIN  C) 1000 MG tablet Take 1,000 mg by mouth daily.     benzonatate (TESSALON) 200 MG capsule Take 1 capsule (200 mg total) by mouth 2 (two) times daily as needed for cough. 20 capsule 0   Calcium Carb-Cholecalciferol (CALCIUM 600 + D PO) Take 1 tablet by mouth daily.     Lidocaine-Glycerin (PREPARATION H EX) Apply topically daily as needed.     Multiple Vitamin (MULTI-VITAMIN) tablet daily.  by Theophilus Bones CMA daily     naproxen sodium (ALEVE) 220 MG tablet Take 220 mg by mouth as needed.     Chromium 200 MCG CAPS Take 1 capsule by mouth daily. (Patient not taking: Reported on 11/10/2020)     clobetasol ointment (TEMOVATE) 0.05 % Use a pea sized amount BID for up to 2 weeks as needed. Not for daily long term use. (Patient not taking: Reported on 11/10/2020) 30 g 1   estradiol (ESTRACE) 0.1 MG/GM vaginal cream 1 gram vaginally qhs x 1 week, then change to twice weekly (Patient not taking: Reported on 11/10/2020) 42.5 g 1   No current facility-administered medications for this visit.    No Known Allergies  Social History   Socioeconomic History   Marital status: Media planner    Spouse name: Not on file   Number of children: 2   Years of education: Not on file   Highest education level: Not on file  Occupational History   Occupation: ACCOUNTANT    Employer: [employer]  Tobacco Use   Smoking  status: Former   Smokeless tobacco: Never   Tobacco comments:    early 20's  Vaping Use   Vaping Use: Never used  Substance and Sexual Activity   Alcohol use: Yes    Alcohol/week: 0.0 - 1.0 standard drinks   Drug use: No   Sexual activity: Not Currently    Birth control/protection: Post-menopausal  Other Topics Concern   Not on file  Social History Narrative   Very active. Eats healthy food choices. Takes MVM. Twin girls   Social Determinants of Health   Financial Resource Strain: Not on file  Food Insecurity: Not on file  Transportation Needs: Not on file  Physical  Activity: Not on file  Stress: Not on file  Social Connections: Not on file  Intimate Partner Violence: Not on file    ROS Per hpi   Objective   Vitals as reported by the patient: Today's Vitals   11/10/20 1251  Temp: 97.8 F (36.6 C)    Yazlin was seen today for covid positive.  Diagnoses and all orders for this visit:  COVID-19 -     molnupiravir EUA 200 mg CAPS; Take 4 capsules (800 mg total) by mouth 2 (two) times daily for 5 days. -     benzonatate (TESSALON) 200 MG capsule; Take 1 capsule (200 mg total) by mouth 2 (two) times daily as needed for cough.  PLAN Reviewed options for treatment with patient. Opt for molnupiravir.  Tessalon for supportive care Reviewed nonpharm Discussed risks, benefits, alternatives, ER precautions and isolation guidelines with patient who voices understanding. Patient encouraged to call clinic with any questions, comments, or concerns.   I discussed the assessment and treatment plan with the patient. The patient was provided an opportunity to ask questions and all were answered. The patient agreed with the plan and demonstrated an understanding of the instructions.   The patient was advised to call back or seek an in-person evaluation if the symptoms worsen or if the condition fails to improve as anticipated.  I provided 14 minutes of non-face-to-face time during this encounter.  Janeece Agee, NP

## 2020-11-10 NOTE — Telephone Encounter (Signed)
Patient states she has already received her visit.

## 2020-11-10 NOTE — Patient Instructions (Signed)
° ° ° °  If you have lab work done today you will be contacted with your lab results within the next 2 weeks.  If you have not heard from us then please contact us. The fastest way to get your results is to register for My Chart. ° ° °IF you received an x-ray today, you will receive an invoice from [facility]. Please contact [facility] at [phone] with questions or concerns regarding your invoice.  ° °IF you received labwork today, you will receive an invoice from [facility]. Please contact [facility] at [phone] with questions or concerns regarding your invoice.  ° °Our billing staff will not be able to assist you with questions regarding bills from these companies. ° °You will be contacted with the lab results as soon as they are available. The fastest way to get your results is to activate your My Chart account. Instructions are located on the last page of this paperwork. If you have not heard from us regarding the results in 2 weeks, please contact this office. °  ° ° ° °

## 2021-04-06 ENCOUNTER — Other Ambulatory Visit: Payer: Self-pay

## 2021-04-06 ENCOUNTER — Ambulatory Visit (INDEPENDENT_AMBULATORY_CARE_PROVIDER_SITE_OTHER): Payer: BC Managed Care – PPO | Admitting: Orthopaedic Surgery

## 2021-04-06 ENCOUNTER — Ambulatory Visit: Payer: Self-pay

## 2021-04-06 DIAGNOSIS — M25512 Pain in left shoulder: Secondary | ICD-10-CM

## 2021-04-06 DIAGNOSIS — G8929 Other chronic pain: Secondary | ICD-10-CM | POA: Diagnosis not present

## 2021-04-06 NOTE — Progress Notes (Signed)
Office Visit Note   Patient: Mary Mahoney           Date of Birth: 01-14-1961           MRN: [id number] Visit Date: 04/06/2021              Requested by: Willow Ora, MD [address] [place],  [place] [address] PCP: Willow Ora, MD   Assessment & Plan: Visit Diagnoses:  1. Chronic left shoulder pain     Plan: Given history of injury fall onto outstretched hand and exam findings impression is traumatic left SLAP tear.  We will order an MR arthrogram to assess for this.  Follow-up after the MRI.  Follow-Up Instructions: No follow-ups on file.   Orders:  Orders Placed This Encounter  Procedures   XR Shoulder Left   No orders of the defined types were placed in this encounter.     Procedures: No procedures performed   Clinical Data: No additional findings.   Subjective: Chief Complaint  Patient presents with   Left Shoulder - Pain    Mary Mahoney is a very pleasant 60 year old female who is well-known to me for last for several years who comes in for left shoulder pain for about a month without any improvement with rest and activity modifications.  She had a fall onto an outstretched hand while she was running and since then the pain has not gotten any better.  The pain is worse with any lifting overhead.  Denies any numbness and tingling or radicular symptoms.  She is having trouble with daily activities such as picking up a case of water.   Review of Systems  Constitutional: Negative.   HENT: Negative.    Eyes: Negative.   Respiratory: Negative.    Cardiovascular: Negative.   Endocrine: Negative.   Musculoskeletal: Negative.   Neurological: Negative.   Hematological: Negative.   Psychiatric/Behavioral: Negative.    All other systems reviewed and are negative.   Objective: Vital Signs: LMP 07/14/2011   Physical Exam Vitals and nursing note reviewed.  Constitutional:      Appearance: She is well-developed.  Pulmonary:     Effort:  Pulmonary effort is normal.  Skin:    General: Skin is warm.     Capillary Refill: Capillary refill takes less than 2 seconds.  Neurological:     Mental Status: She is alert and oriented to person, place, and time.  Psychiatric:        Behavior: Behavior normal.        Thought Content: Thought content normal.        Judgment: Judgment normal.    Ortho Exam  Left shoulder examination shows a fully healed surgical scar.  There is no redness or signs of infection.  There is mild discomfort with cross body adduction test.  Pain with O'Brien and crank test.  Manual muscle testing of the rotator cuff is normal.  Passive and active range of motion are normal.  Specialty Comments:  No specialty comments available.  Imaging: XR Shoulder Left  Result Date: 04/06/2021 No acute abnormalities.  There is no widening of the coracoclavicular distance.  No evidence of hardware complications.  AC joint alignment is stable.    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   History of right hip replacement 01/22/2019   Lichen sclerosus of female genitalia 01/22/2019   History of left  AC joint reconstruction 08/17/2017   Past Medical History:  Diagnosis Date   Anemia, as younger woman    Osteoarthritis    Separation of AC joint, left, s/p repair     Family History  Problem Relation Age of Onset   Lung disease Mother    Heart attack Mother    Heart disease Mother    Diabetes Mother    Peripheral Artery Disease Father    Lung cancer Father    Melanoma Paternal Uncle    Cancer Paternal Grandmother     Past Surgical History:  Procedure Laterality Date   ACROMIO-CLAVICULAR JOINT REPAIR Left 08/10/2017   Procedure: LEFT ACROMIO-CLAVICULAR JOINT REPAIR;  Surgeon: Tarry Kos, MD;  Location: [facility] OR;  Service: Orthopedics;  Laterality: Left;   COLONOSCOPY  09/22/2014   REFRACTIVE SURGERY Bilateral    TOTAL HIP  ARTHROPLASTY Right 04/21/2015   Procedure: RIGHT TOTAL HIP ARTHROPLASTY ANTERIOR APPROACH;  Surgeon: Tarry Kos, MD;  Location: [facility] OR;  Service: Orthopedics;  Laterality: Right;   Social History   Occupational History   Occupation: Agricultural engineer: [employer]  Tobacco Use   Smoking status: Former   Smokeless tobacco: Never   Tobacco comments:    early 20's  Vaping Use   Vaping Use: Never used  Substance and Sexual Activity   Alcohol use: Yes    Alcohol/week: 0.0 - 1.0 standard drinks   Drug use: No   Sexual activity: Not Currently    Birth control/protection: Post-menopausal

## 2021-04-15 ENCOUNTER — Ambulatory Visit
Admission: RE | Admit: 2021-04-15 | Discharge: 2021-04-15 | Disposition: A | Discharge status: Home / Self Care | Payer: BC Managed Care – PPO | Source: Ambulatory Visit | Attending: Orthopaedic Surgery | Admitting: Orthopaedic Surgery

## 2021-04-15 ENCOUNTER — Other Ambulatory Visit: Payer: Self-pay

## 2021-04-15 DIAGNOSIS — S46012A Strain of muscle(s) and tendon(s) of the rotator cuff of left shoulder, initial encounter: Secondary | ICD-10-CM | POA: Diagnosis not present

## 2021-04-15 DIAGNOSIS — Z0389 Encounter for observation for other suspected diseases and conditions ruled out: Secondary | ICD-10-CM | POA: Diagnosis not present

## 2021-04-15 DIAGNOSIS — G8929 Other chronic pain: Secondary | ICD-10-CM

## 2021-04-15 DIAGNOSIS — M25512 Pain in left shoulder: Secondary | ICD-10-CM

## 2021-04-15 MED ORDER — IOPAMIDOL (ISOVUE-M 200) INJECTION 41%
15.0000 mL | Freq: Once | INTRAMUSCULAR | Status: AC
  Administered 2021-04-15: 15 mL via INTRA_ARTICULAR

## 2021-04-19 ENCOUNTER — Encounter: Payer: Self-pay | Admitting: Orthopaedic Surgery

## 2021-04-19 ENCOUNTER — Other Ambulatory Visit: Payer: Self-pay

## 2021-04-19 ENCOUNTER — Ambulatory Visit (INDEPENDENT_AMBULATORY_CARE_PROVIDER_SITE_OTHER): Payer: BC Managed Care – PPO | Admitting: Orthopaedic Surgery

## 2021-04-19 DIAGNOSIS — G8929 Other chronic pain: Secondary | ICD-10-CM

## 2021-04-19 DIAGNOSIS — M25512 Pain in left shoulder: Secondary | ICD-10-CM

## 2021-04-19 NOTE — Progress Notes (Signed)
Office Visit Note   Patient: Mary Mahoney           Date of Birth: 02-02-1961           MRN: [id number] Visit Date: 04/19/2021              Requested by: Willow Ora, MD [address] [place],  [place] [address] PCP: Willow Ora, MD   Assessment & Plan: Visit Diagnoses:  1. Chronic left shoulder pain     Plan: MRI of the left shoulder shows posterior labral tear and superior labral degeneration.  There is moderate tendinosis of the supraspinatus with a small full-thickness tear.  Treatment options were discussed and I explained that we could try to rehab the small supraspinatus tear with cortisone injection for symptomatic relief as well as backing off on activity and home exercises.  I did explain that research studies show that the small full-thickness tears do have a tendency to enlarge over time.  Based on her options we agreed to do a trial of cortisone injection in the shoulder joint which should help with both pathologic findings and 6 weeks of activity modification and rest.  She will let me know if the relief from the injection is short-lived.  Follow-Up Instructions: No follow-ups on file.   Orders:  Orders Placed This Encounter  Procedures   Ambulatory referral to Physical Medicine Rehab   No orders of the defined types were placed in this encounter.     Procedures: No procedures performed   Clinical Data: No additional findings.   Subjective: Chief Complaint  Patient presents with   Left Shoulder - Pain    HPI  Mary Mahoney is back today to discuss left shoulder MRI.  She has trouble with daily activities such as carrying groceries and she has try compensating for the pain.  Review of Systems   Objective: Vital Signs: LMP 07/14/2011   Physical Exam  Ortho Exam  Left shoulder exam shows mild pain with empty can test.  There is no weakness.  Other portions of the exam are unchanged.  Specialty Comments:  No specialty comments  available.  Imaging: No results found.   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   History of right hip replacement 01/22/2019   Lichen sclerosus of female genitalia 01/22/2019   History of left  AC joint reconstruction 08/17/2017   Past Medical History:  Diagnosis Date   Anemia, as younger woman    Osteoarthritis    Separation of AC joint, left, s/p repair     Family History  Problem Relation Age of Onset   Lung disease Mother    Heart attack Mother    Heart disease Mother    Diabetes Mother    Peripheral Artery Disease Father    Lung cancer Father    Melanoma Paternal Uncle    Cancer Paternal Grandmother     Past Surgical History:  Procedure Laterality Date   ACROMIO-CLAVICULAR JOINT REPAIR Left 08/10/2017   Procedure: LEFT ACROMIO-CLAVICULAR JOINT REPAIR;  Surgeon: Tarry Kos, MD;  Location: [facility] OR;  Service: Orthopedics;  Laterality: Left;   COLONOSCOPY  09/22/2014   REFRACTIVE SURGERY Bilateral    TOTAL HIP ARTHROPLASTY Right 04/21/2015   Procedure: RIGHT TOTAL HIP ARTHROPLASTY ANTERIOR APPROACH;  Surgeon: Tarry Kos, MD;  Location: [facility];  Service: Orthopedics;  Laterality: Right;   Social History  Occupational History   Occupation: Agricultural engineer: [employer]  Tobacco Use   Smoking status: Former   Smokeless tobacco: Never   Tobacco comments:    early 20's  Vaping Use   Vaping Use: Never used  Substance and Sexual Activity   Alcohol use: Yes    Alcohol/week: 0.0 - 1.0 standard drinks   Drug use: No   Sexual activity: Not Currently    Birth control/protection: Post-menopausal

## 2021-05-16 ENCOUNTER — Ambulatory Visit: Payer: Self-pay

## 2021-05-16 ENCOUNTER — Ambulatory Visit (INDEPENDENT_AMBULATORY_CARE_PROVIDER_SITE_OTHER): Payer: BC Managed Care – PPO | Admitting: Physical Medicine and Rehabilitation

## 2021-05-16 ENCOUNTER — Encounter: Payer: Self-pay | Admitting: Physical Medicine and Rehabilitation

## 2021-05-16 ENCOUNTER — Other Ambulatory Visit: Payer: Self-pay

## 2021-05-16 DIAGNOSIS — M25512 Pain in left shoulder: Secondary | ICD-10-CM

## 2021-05-16 DIAGNOSIS — G8929 Other chronic pain: Secondary | ICD-10-CM | POA: Diagnosis not present

## 2021-05-16 MED ORDER — BUPIVACAINE HCL 0.25 % IJ SOLN
5.0000 mL | INTRAMUSCULAR | Status: AC | PRN
Start: 2021-05-16 — End: 2021-05-16
  Administered 2021-05-16: 5 mL via INTRA_ARTICULAR

## 2021-05-16 MED ORDER — TRIAMCINOLONE ACETONIDE 40 MG/ML IJ SUSP
40.0000 mg | INTRAMUSCULAR | Status: AC | PRN
  Administered 2021-05-16: 40 mg via INTRA_ARTICULAR

## 2021-05-16 NOTE — Progress Notes (Signed)
   Mary Mahoney - 60 y.o. female MRN [id number]  Date of birth: 1960/08/17  Office Visit Note: Visit Date: 05/16/2021 PCP: Willow Ora, MD Referred by: Willow Ora, MD  Subjective: Chief Complaint  Patient presents with   Left Shoulder - Pain   HPI:  Mary Mahoney is a 60 y.o. female who comes in today at the request of Dr. Glee Arvin for planned Left anesthetic glenohumeral arthrogram with fluoroscopic guidance.  The patient has failed conservative care including home exercise, medications, time and activity modification.  This injection will be diagnostic and hopefully therapeutic.  Please see requesting physician notes for further details and justification.   ROS Otherwise per HPI.  Assessment & Plan: Visit Diagnoses:    ICD-10-CM   1. Chronic left shoulder pain  M25.512 Large Joint Inj: L glenohumeral   G89.29 XR C-ARM NO REPORT      Plan: No additional findings.   Meds & Orders: No orders of the defined types were placed in this encounter.   Orders Placed This Encounter  Procedures   Large Joint Inj: L glenohumeral   XR C-ARM NO REPORT    Follow-up: No follow-ups on file.   Procedures: Large Joint Inj: L glenohumeral on 05/16/2021 8:33 AM Indications: pain and diagnostic evaluation Details: 22 G 3.5 in needle, fluoroscopy-guided anteromedial approach  Arthrogram: No  Medications: 40 mg triamcinolone acetonide 40 MG/ML; 5 mL bupivacaine 0.25 % Outcome: tolerated well, no immediate complications  There was excellent flow of contrast producing a partial arthrogram of the glenohumeral joint. The patient did have mild relief of symptoms during the anesthetic phase of the injection. Procedure, treatment alternatives, risks and benefits explained, specific risks discussed. Consent was given by the patient. Immediately prior to procedure a time out was called to verify the correct patient, procedure, equipment, support staff and site/side marked as required.  Patient was prepped and draped in the usual sterile fashion.         Clinical History: No specialty comments available.     Objective:  VS:  HT:    WT:   BMI:     BP:   HR: bpm  TEMP: ( )  RESP:  Physical Exam   Imaging: No results found.

## 2021-05-16 NOTE — Progress Notes (Signed)
Left shoulder pain after injury when trail running, fell, and caught herself on outstretched arm about 3 months ago.  She is not taking anything for pain.

## 2021-11-24 IMAGING — XA DG FLUORO GUIDE NDL PLC/BX
2 series · 2 of 2 positions shown · IV contrast (multihance)
Comparison: none

CLINICAL DATA: Left shoulder arthrogram for MRI

PROCEDURE:
Left SHOULDER INJECTION UNDER FLUOROSCOPY
TECHNIQUE: An appropriate skin entrance site was determined. The site was
marked, prepped with Betadine, draped in the usual sterile fashion,
and infiltrated locally with buffered Lidocaine. 20 gauge spinal
needle was advanced to the superomedial margin of the humeral head
under intermittent fluoroscopy. 1 ml of Lidocaine injected easily. A
mixture of 0.1 ml Multihance mixed in diluted Omnipaque 300 was then
used to opacify the left shoulder capsule. No immediate
complication.
FLUOROSCOPY TIME:  Fluoroscopy Time:  33 seconds with 2 exposures

[Series 1: ortho adipose · 1 of 1 slices shown (1 of 2)]
[im 1/1]
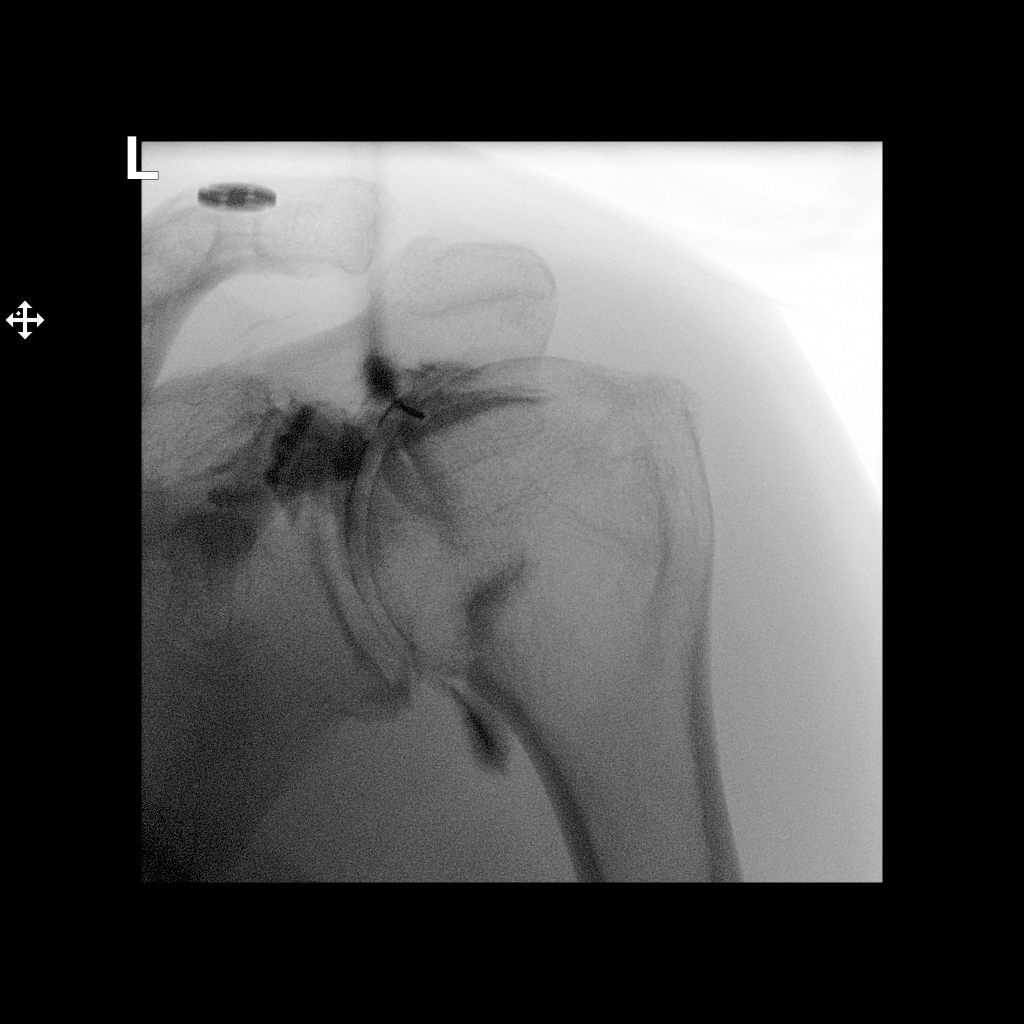

[Series 2: ortho adipose · 1 of 1 slices shown (2 of 2)]
[im 1/1]
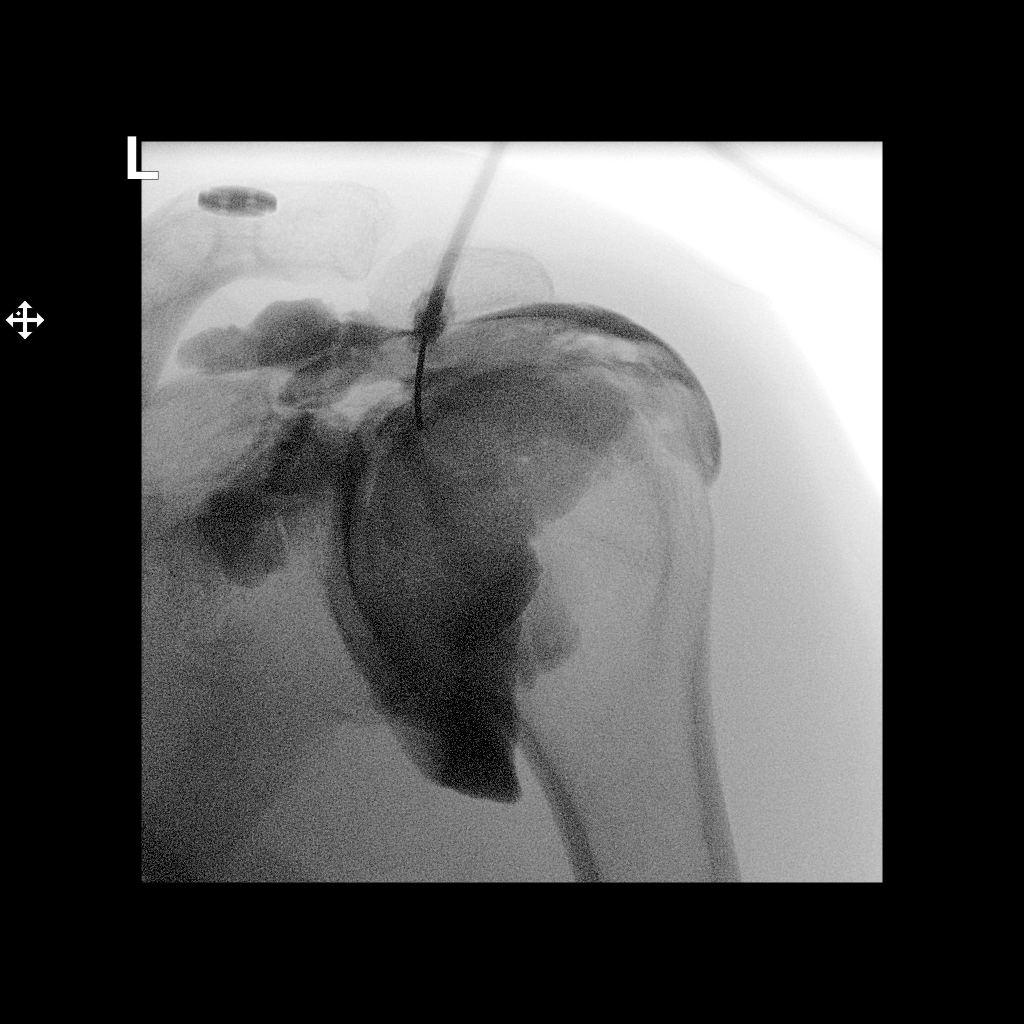

[2 of 2 positions shown; findings below may reference images not displayed]

FINDINGS: There is free flow of contrast material within the left shoulder
joint.
IMPRESSION: Technically successful left shoulder injection under fluoroscopy for
MRI.

## 2021-11-24 IMAGING — MR MR SHOULDER*L* W/ CM
6 series · 40 of 40 positions shown · IV contrast (agent unspecified)
Comparison: None.

CLINICAL DATA: Left shoulder pain and weakness status post fall 2
months ago

EXAM:
MR ARTHROGRAM OF THE LEFT SHOULDER
TECHNIQUE: Multiplanar, multisequence MR imaging of the left shoulder was
performed following the administration of intra-articular contrast.
CONTRAST:  See Injection Documentation.

[Series 3: T1 fat-sat · axial · 4.0mm · 0.27mm/px · z∈[-36,+69]mm · 7 of 22 slices shown (1 of 4)]
[im 1/22]
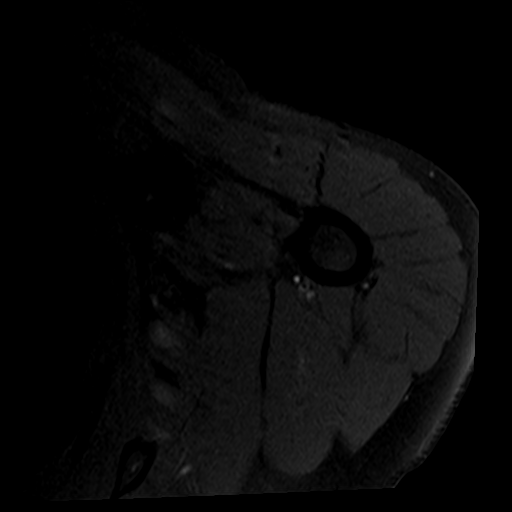
[im 4/22]
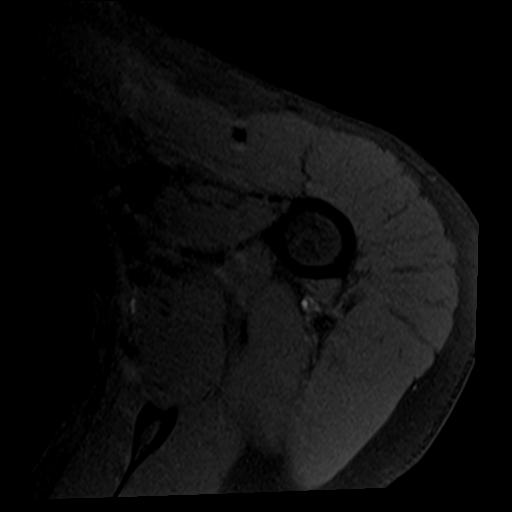
[im 8/22]
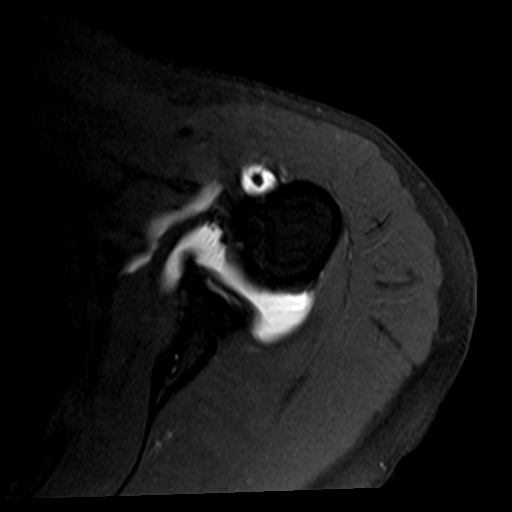
[im 11/22]
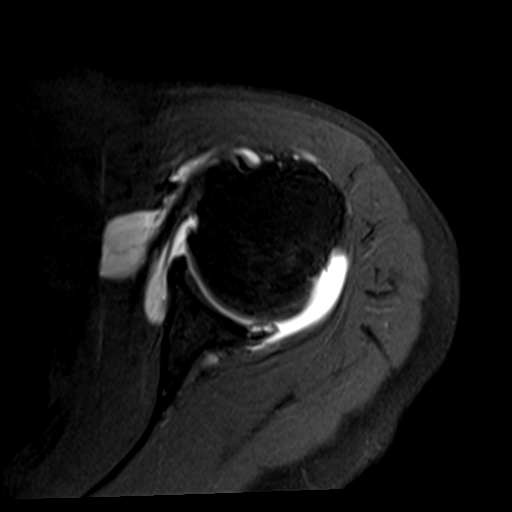
[im 15/22]
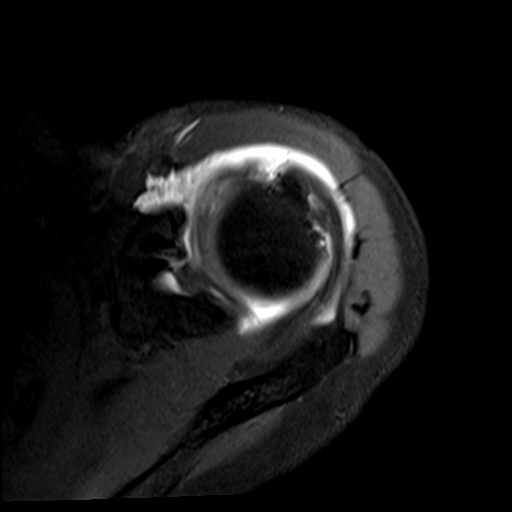
[im 18/22]
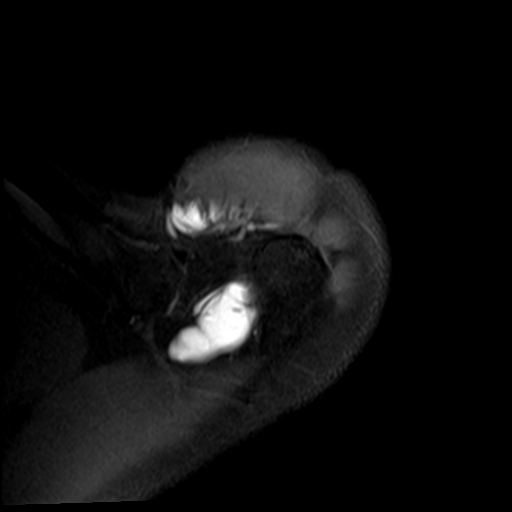
[im 22/22]
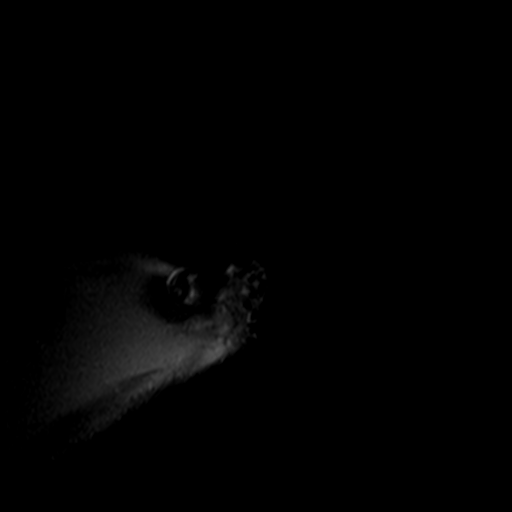

[Series 4: T2 fat-sat · oblique · 4.0mm · 0.55mm/px · 6 of 19 slices shown (1 of 2)]
[im 1/19]
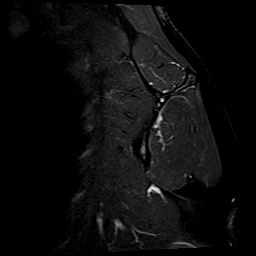
[im 4/19]
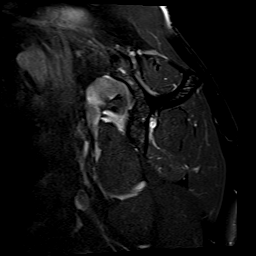
[im 8/19]
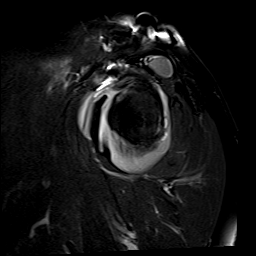
[im 11/19]
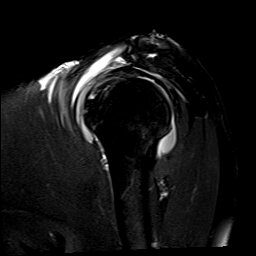
[im 15/19]
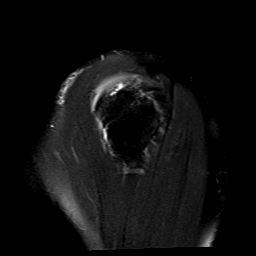
[im 19/19]
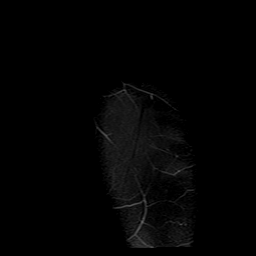

[Series 5: T1 fat-sat · oblique · 4.0mm · 0.55mm/px · 7 of 19 slices shown (2 of 4)]
[im 1/19]
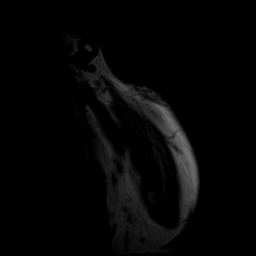
[im 4/19]
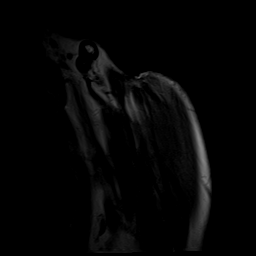
[im 7/19]
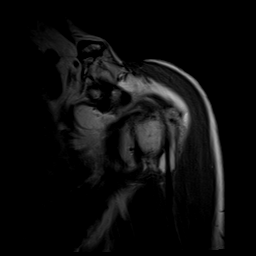
[im 10/19]
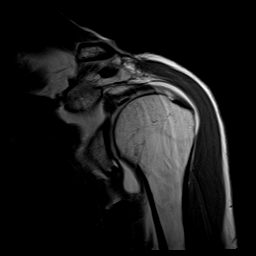
[im 13/19]
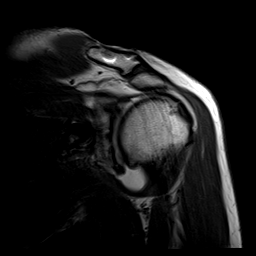
[im 16/19]
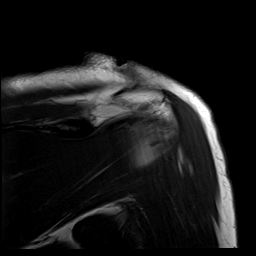
[im 19/19]
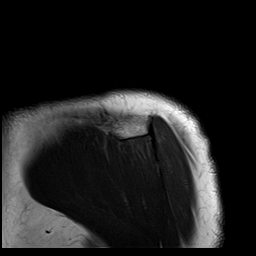

[Series 6: T1 fat-sat · oblique · 4.0mm · 0.55mm/px · 7 of 19 slices shown (3 of 4)]
[im 1/19]
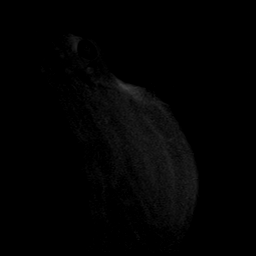
[im 4/19]
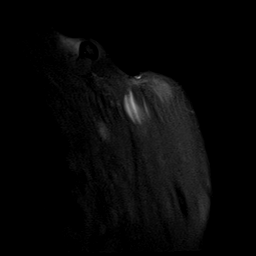
[im 7/19]
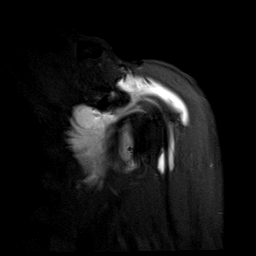
[im 10/19]
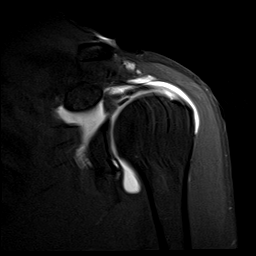
[im 13/19]
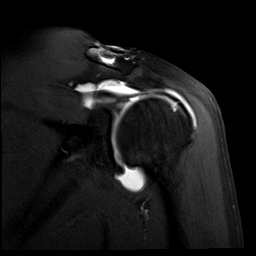
[im 16/19]
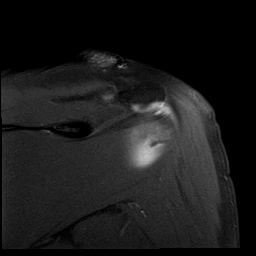
[im 19/19]
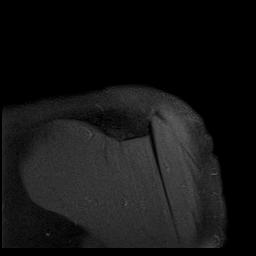

[Series 7: T2 fat-sat · oblique · 4.0mm · 0.55mm/px · 7 of 19 slices shown (2 of 2)]
[im 1/19]
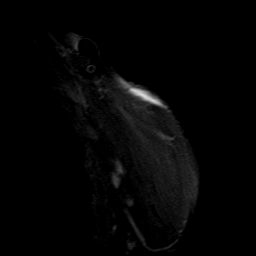
[im 4/19]
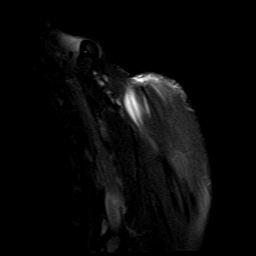
[im 7/19]
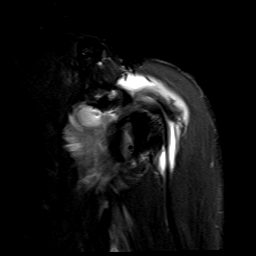
[im 10/19]
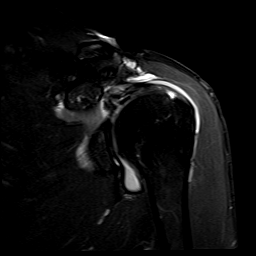
[im 13/19]
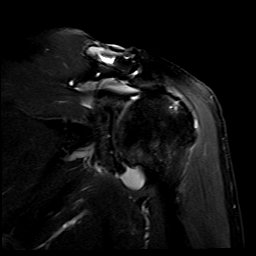
[im 16/19]
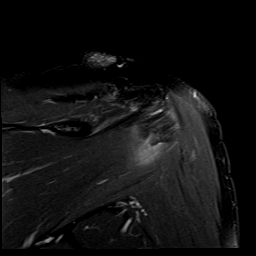
[im 19/19]
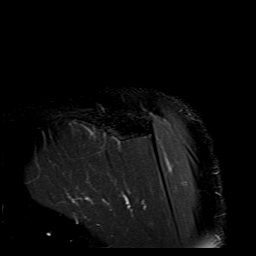

[Series 10: T1 fat-sat · sagittal · 4.0mm · 0.59mm/px · 6 of 18 slices shown (4 of 4)]
[im 1/18]
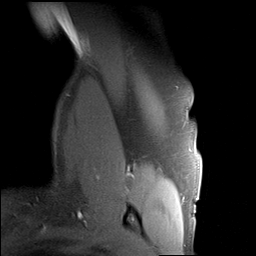
[im 4/18]
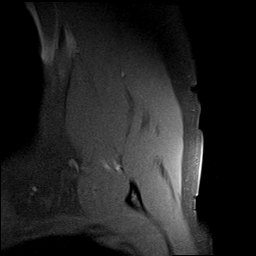
[im 7/18]
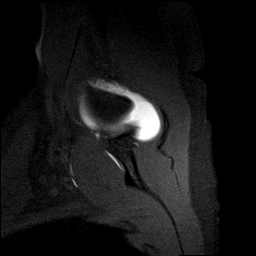
[im 11/18]
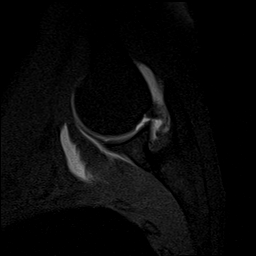
[im 14/18]
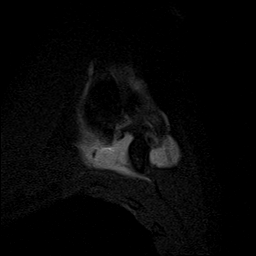
[im 18/18]
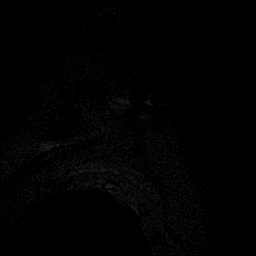

[40 of 40 positions shown; findings below may reference images not displayed]

FINDINGS: Rotator cuff: Moderate tendinosis of the supraspinatus tendon with a
small full-thickness tear mild tendinosis of the infraspinatus
tendon. Teres minor tendon is intact. Mild tendinosis of the
subscapularis tendon.

Muscles: No muscle atrophy or edema. No intramuscular fluid
collection or hematoma.

Biceps Long Head: Mild tendinosis of the intra-articular portion of
the long head of the biceps tendon.

Acromioclavicular Joint: Moderate arthropathy of the
acromioclavicular joint. Postsurgical changes involving the distal
clavicle from prior AC joint repair. Contrast in the
subacromial/subdeltoid bursa.

Glenohumeral Joint: Intraarticular contrast distending the joint
capsule. No focal chondral defect. Normal glenohumeral ligaments.

Labrum: Posterior labral tear.  Superior labral degeneration.

Bones: No fracture or dislocation. No aggressive osseous lesion.

Other: No fluid collection or hematoma.
IMPRESSION: 1. Moderate tendinosis of the supraspinatus tendon with a small
full-thickness tear.
2. Mild tendinosis of the infraspinatus tendon.
3. Mild tendinosis of the subscapularis tendon.
4. Mild tendinosis of the intra-articular portion of the long head
of the biceps tendon.

## 2022-03-06 ENCOUNTER — Encounter: Payer: Self-pay | Admitting: *Deleted

## 2022-05-25 ENCOUNTER — Encounter: Payer: Self-pay | Admitting: *Deleted

## 2022-07-04 NOTE — Progress Notes (Signed)
62 y.o. [id number] Domestic Partner White or Caucasian Not Hispanic or Latino female here for annual exam. She has a h/o lichen sclerosis and uses clobetasol ointment on occasion.    She has had issues with vaginal atrophy, not helped with vaginal estrogen or dilators. Not sexually active.   She has issues with constipation. She has a BM a few times a week, she has intermittent abdominal discomfort from gas.   Patient's last menstrual period was 07/14/2011.          Sexually active: No.  The current method of family planning is post menopausal status.    Exercising: Yes.     Weights x2 a week and running and biking.  Smoker:  no  Health Maintenance: Pap:  10/09/17 Neg:Neg HR HPV             07/31/14 Neg:Neg HR HPV History of abnormal Pap:  no MMG:  02/13/19 incomplete - patient will schedule  BMD:   never  Colonoscopy: 09/22/14 f/u 10 years  TDaP:  12/08/19 Gardasil: n/a   reports that she has quit smoking. She has never used smokeless tobacco. She reports current alcohol use. She reports that she does not use drugs. Rare ETOH. 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 She has identical twins, 57 (female and transgender female). Has an adult son, local. No grandchildren.    Past Medical History:  Diagnosis Date   Anemia, as younger woman    Osteoarthritis    Separation of AC joint, left, s/p repair     Past Surgical History:  Procedure Laterality Date   ACROMIO-CLAVICULAR JOINT REPAIR Left 08/10/2017   Procedure: LEFT ACROMIO-CLAVICULAR JOINT REPAIR;  Surgeon: Leandrew Koyanagi, MD;  Location: [facility];  Service: Orthopedics;  Laterality: Left;   COLONOSCOPY  09/22/2014   REFRACTIVE SURGERY Bilateral    TOTAL HIP ARTHROPLASTY Right 04/21/2015   Procedure: RIGHT TOTAL HIP ARTHROPLASTY ANTERIOR APPROACH;  Surgeon: Leandrew Koyanagi, MD;  Location: [facility];  Service: Orthopedics;  Laterality: Right;    Current Outpatient Medications  Medication Sig Dispense Refill   acetaminophen  (TYLENOL) 500 MG tablet Take 500-1,000 mg by mouth every 8 (eight) hours as needed for mild pain or moderate pain.     Ascorbic Acid (VITAMIN C) 1000 MG tablet Take 1,000 mg by mouth daily.     Calcium Carb-Cholecalciferol (CALCIUM 600 + D PO) Take 1 tablet by mouth daily.     Chromium 200 MCG CAPS Take 1 capsule by mouth daily.     Multiple Vitamin (MULTI-VITAMIN) tablet daily.  by Evlyn Courier CMA daily     clobetasol ointment (TEMOVATE) 0.05 % Use a pea sized amount BID for up to 2 weeks as needed. Not for daily long term use. 30 g 1   naproxen sodium (ALEVE) 220 MG tablet Take 220 mg by mouth as needed.     No current facility-administered medications for this visit.    Family History  Problem Relation Age of Onset   Lung disease Mother    Heart attack Mother    Heart disease Mother    Diabetes Mother    Peripheral Artery Disease Father    Lung cancer Father    Melanoma Paternal Uncle    Cancer Paternal Grandmother     Review of Systems  All other systems reviewed and are negative.   Exam:   BP 110/72   Pulse 68   Ht 4' 11.5" (1.511 m)   Wt 126  lb (57.2 kg)   LMP 07/14/2011   SpO2 98%   BMI 25.02 kg/m   Weight change: @WEIGHTCHANGE @ Height:   Height: 4' 11.5" (151.1 cm)  Ht Readings from Last 3 Encounters:  07/11/22 4' 11.5" (1.511 m)  07/06/20 4' 11.75" (1.518 m)  01/23/20 5' (1.524 m)    General appearance: alert, cooperative and appears stated age Head: Normocephalic, without obvious abnormality, atraumatic Neck: no adenopathy, supple, symmetrical, trachea midline and thyroid normal to inspection and palpation Lungs: clear to auscultation bilaterally Cardiovascular: regular rate and rhythm Breasts: normal appearance, no masses or tenderness Abdomen: soft, non-tender; non distended,  no masses,  no organomegaly Extremities: extremities normal, atraumatic, no cyanosis or edema Skin: Skin color, texture, turgor normal. No rashes or lesions Lymph nodes:  Cervical, supraclavicular, and axillary nodes normal. No abnormal inguinal nodes palpated Neurologic: Grossly normal   Pelvic: External genitalia:  no lesions, slight whitening under the clitoris and in the posterior fourchette. No loss of architecture.               Urethra:  normal appearing urethra with no masses, tenderness or lesions              Bartholins and Skenes: normal                 Vagina: atrophic appearing vagina with normal color and discharge, no lesions              Cervix: no lesions               Bimanual Exam:  Uterus:  normal size, contour, position, consistency, mobility, non-tender              Adnexa: no mass, fullness, tenderness               Rectovaginal: Confirms               Anus:  normal sphincter tone, no lesions  Gae Dry, CMA chaperoned for the exam.  1. Well woman exam Discussed breast self exam Discussed calcium and vit D intake Labs with primary Mammogram overdue, she will schedule  2. Lichen sclerosus of female genitalia - clobetasol ointment (TEMOVATE) 0.05 %; Use a pea sized amount BID for up to 2 weeks as needed. Not for daily long term use.  Dispense: 30 g; Refill: 1  3. Screening for cervical cancer - Cytology - PAP

## 2022-07-11 ENCOUNTER — Ambulatory Visit (INDEPENDENT_AMBULATORY_CARE_PROVIDER_SITE_OTHER): Payer: BC Managed Care – PPO | Admitting: Obstetrics and Gynecology

## 2022-07-11 ENCOUNTER — Encounter: Payer: Self-pay | Admitting: Obstetrics and Gynecology

## 2022-07-11 ENCOUNTER — Other Ambulatory Visit (HOSPITAL_COMMUNITY)
Admission: RE | Admit: 2022-07-11 | Discharge: 2022-07-11 | Disposition: A | Discharge status: Home / Self Care | Payer: BC Managed Care – PPO | Source: Ambulatory Visit | Attending: Obstetrics and Gynecology | Admitting: Obstetrics and Gynecology

## 2022-07-11 VITALS — BP 110/72 | HR 68 | Ht 59.5 in | Wt 126.0 lb

## 2022-07-11 DIAGNOSIS — N904 Leukoplakia of vulva: Secondary | ICD-10-CM | POA: Diagnosis not present

## 2022-07-11 DIAGNOSIS — Z124 Encounter for screening for malignant neoplasm of cervix: Secondary | ICD-10-CM

## 2022-07-11 DIAGNOSIS — Z01419 Encounter for gynecological examination (general) (routine) without abnormal findings: Secondary | ICD-10-CM

## 2022-07-11 MED ORDER — CLOBETASOL PROPIONATE 0.05 % EX OINT: TOPICAL_OINTMENT | CUTANEOUS | 1 refills | Status: DC

## 2022-07-11 NOTE — Patient Instructions (Signed)

## 2022-07-13 LAB — CYTOLOGY - PAP
Comment: NEGATIVE
Diagnosis: NEGATIVE
High risk HPV: NEGATIVE

## 2022-08-02 ENCOUNTER — Inpatient Hospital Stay (HOSPITAL_BASED_OUTPATIENT_CLINIC_OR_DEPARTMENT_OTHER): Admission: RE | Admit: 2022-08-02 | Payer: BC Managed Care – PPO | Source: Ambulatory Visit | Admitting: Radiology

## 2022-08-02 DIAGNOSIS — Z1231 Encounter for screening mammogram for malignant neoplasm of breast: Secondary | ICD-10-CM

## 2022-08-21 ENCOUNTER — Encounter: Payer: Self-pay | Admitting: Family Medicine

## 2022-08-21 ENCOUNTER — Ambulatory Visit (INDEPENDENT_AMBULATORY_CARE_PROVIDER_SITE_OTHER): Payer: BC Managed Care – PPO | Admitting: Family Medicine

## 2022-08-21 VITALS — BP 108/60 | HR 58 | Temp 98.0°F | Ht 59.5 in | Wt 131.6 lb

## 2022-08-21 DIAGNOSIS — Z0000 Encounter for general adult medical examination without abnormal findings: Secondary | ICD-10-CM

## 2022-08-21 DIAGNOSIS — R928 Other abnormal and inconclusive findings on diagnostic imaging of breast: Secondary | ICD-10-CM

## 2022-08-21 NOTE — Patient Instructions (Addendum)
Please return in 12 months for your annual complete physical; please come fasting.   I will release your lab results to you on your MyChart account with further instructions. You may see the results before I do, but when I review them I will send you a message with my report or have my assistant call you if things need to be discussed. Please reply to my message with any questions. Thank you!   Please call and schedule your mammogram: '[x]'$   Mammogram , diagnostic '[]'$   Bone Density  Please call the office checked below to schedule your appointment:  '[x]'$   [facility]      [place], [place]         '[]'$   [facility]  [place], [place]   If you have any questions or concerns, please don't hesitate to send me a message via MyChart or call the office at [phone]. Thank you for visiting with [place] today! It's our pleasure caring for you.

## 2022-08-21 NOTE — Progress Notes (Signed)
Subjective  Chief Complaint  Patient presents with   Annual Exam    HPI: Mary Mahoney is a 62 y.o. female who presents to [facility] today for a Female Wellness Visit.   Wellness Visit: annual visit with health maintenance review and exam without Pap  HM: sees GYN for female wellness, last 06/2022; nl pap. Imms current. Healthy lifestyle. Last here with me in 2021. Reviewed chart.  Healthy 62 yo domestic common law marriage with 3 children: adult son in [place], 60 yo twins, identical: daughter and transgender FtoM; both thriving at [employer].  Busy full time job, trail runs for exercise (10 mile trail run last weekend).  Last mammo 2020 and had abnormality but did not have 6 mo recheck. Needs checked now. No abnormalities on exam.   Assessment  1. Annual physical exam   2. Mammogram abnormal      Plan  Female Wellness Visit: Age appropriate Health Maintenance and Prevention measures were discussed with patient. Included topics are cancer screening recommendations, ways to keep healthy (see AVS) including dietary and exercise recommendations, regular eye and dental care, use of seat belts, and avoidance of moderate alcohol use and tobacco use. Pt to schedule mammogram, ordered diagnostic due to h/o abnormal.  BMI: discussed patient's BMI and encouraged positive lifestyle modifications to help get to or maintain a target BMI. HM needs and immunizations were addressed and ordered. See below for orders. See HM and immunization section for updates. Routine labs and screening tests ordered including cmp, cbc and lipids where appropriate. Discussed recommendations regarding Vit D and calcium supplementation (see AVS)  Follow up: 12 mo for cpe   Orders Placed This Encounter  Procedures   MM Digital Diagnostic Bilat   CBC with Differential/Platelet   Comprehensive metabolic panel   Lipid panel   No orders of the defined types were placed in this  encounter.     Body mass index is 26.14 kg/m. Wt Readings from Last 3 Encounters:  08/21/22 131 lb 9.6 oz (59.7 kg)  07/11/22 126 lb (57.2 kg)  07/06/20 125 lb (56.7 kg)     Patient Active Problem List   Diagnosis Date Noted   Status post total replacement of right hip [id number]   Lichen sclerosus of female genitalia 01/22/2019   History of left  AC joint reconstruction 08/17/2017   Health Maintenance  Topic Date Due   MAMMOGRAM  02/13/2020   COVID-19 Vaccine (3 - 2023-24 season) 09/06/2022 (Originally 02/10/2022)   INFLUENZA VACCINE  09/25/2022 (Originally 01/10/2022)   COLONOSCOPY (Pts 45-67yr Insurance coverage will need to be confirmed)  09/21/2024   PAP SMEAR-Modifier  07/12/2027   DTaP/Tdap/Td (4 - Td or Tdap) 12/07/2029   Hepatitis C Screening  Completed   HIV Screening  Completed   Zoster Vaccines- Shingrix  Completed   HPV VACCINES  Aged Out   Immunization History  Administered Date(s) Administered   Influenza-Unspecified 04/13/2007   PFIZER(Purple Top)SARS-COV-2 Vaccination 08/15/2019, 09/10/2019   Td 01/06/1998   Tdap 04/02/2012, 12/08/2019   Zoster Recombinat (Shingrix) 04/14/2013, 01/23/2020   We updated and reviewed the patient's past history in detail and it is documented below. Allergies: Patient has No Known Allergies. Past Medical History Patient  has a past medical history of Anemia, as younger woman, Osteoarthritis, and Separation of AC joint, left, s/p repair. Past Surgical History Patient  has a past surgical history that includes Colonoscopy (09/22/2014); Total hip arthroplasty (Right, 04/21/2015); Acromio-clavicular joint repair (Left, 08/10/2017);  and Refractive surgery (Bilateral). Family History: Patient family history includes Cancer in her paternal grandmother; Diabetes in her mother; Heart attack in her mother; Heart disease in her mother; Lung cancer in her father; Lung disease in her mother; Melanoma in her paternal uncle; Peripheral Artery  Disease in her father. Social History:  Patient  reports that she quit smoking about 44 years ago. Her smoking use included cigarettes. She has never used smokeless tobacco. She reports current alcohol use. She reports that she does not use drugs.  Review of Systems: Constitutional: negative for fever or malaise Ophthalmic: negative for photophobia, double vision or loss of vision Cardiovascular: negative for chest pain, dyspnea on exertion, or new LE swelling Respiratory: negative for SOB or persistent cough Gastrointestinal: negative for abdominal pain, change in bowel habits or melena Genitourinary: negative for dysuria or gross hematuria, no abnormal uterine bleeding or disharge Musculoskeletal: negative for new gait disturbance or muscular weakness Integumentary: negative for new or persistent rashes, no breast lumps Neurological: negative for TIA or stroke symptoms Psychiatric: negative for SI or delusions Allergic/Immunologic: negative for hives  Patient Care Team    Relationship Specialty Notifications Start End  Leamon Arnt, MD PCP - General Family Medicine  01/23/20   Salvadore Dom, MD Consulting Physician Obstetrics and Gynecology  01/23/20     Objective  Vitals: BP 108/60   Pulse (!) 58   Temp 98 F (36.7 C)   Ht 4' 11.5" (1.511 m)   Wt 131 lb 9.6 oz (59.7 kg)   LMP 07/14/2011   SpO2 97%   BMI 26.14 kg/m  General:  Well developed, well nourished, no acute distress  Psych:  Alert and orientedx3,normal mood and affect HEENT:  Normocephalic, atraumatic, non-icteric sclera, supple neck without adenopathy, mass or thyromegaly Cardiovascular:  Normal S1, S2, RRR without gallop, rub or murmur Respiratory:  Good breath sounds bilaterally, CTAB with normal respiratory effort Gastrointestinal: normal bowel sounds, soft, non-tender, no noted masses. No HSM MSK: no deformities, contusions. Joints are without erythema or swelling.  Skin:  Warm, no rashes or suspicious  lesions noted  Commons side effects, risks, benefits, and alternatives for medications and treatment plan prescribed today were discussed, and the patient expressed understanding of the given instructions. Patient is instructed to call or message via MyChart if he/she has any questions or concerns regarding our treatment plan. No barriers to understanding were identified. We discussed Red Flag symptoms and signs in detail. Patient expressed understanding regarding what to do in case of urgent or emergency type symptoms.  Medication list was reconciled, printed and provided to the patient in AVS. Patient instructions and summary information was reviewed with the patient as documented in the AVS. This note was prepared with assistance of Dragon voice recognition software. Occasional wrong-word or sound-a-like substitutions may have occurred due to the inherent limitations of voice recognition software

## 2022-08-22 ENCOUNTER — Other Ambulatory Visit: Payer: Self-pay | Admitting: Family Medicine

## 2022-08-22 DIAGNOSIS — R928 Other abnormal and inconclusive findings on diagnostic imaging of breast: Secondary | ICD-10-CM

## 2022-08-22 NOTE — Addendum Note (Signed)
Addended by: Loura Back on: 08/22/2022 10:46 AM   Modules accepted: Orders

## 2022-08-23 ENCOUNTER — Other Ambulatory Visit (INDEPENDENT_AMBULATORY_CARE_PROVIDER_SITE_OTHER): Payer: BC Managed Care – PPO

## 2022-08-23 DIAGNOSIS — Z0000 Encounter for general adult medical examination without abnormal findings: Secondary | ICD-10-CM

## 2022-08-23 LAB — COMPREHENSIVE METABOLIC PANEL
ALT: 33 U/L (ref 0–35)
AST: 26 U/L (ref 0–37)
Albumin: 4.3 g/dL (ref 3.5–5.2)
Alkaline Phosphatase: 67 U/L (ref 39–117)
BUN: 15 mg/dL (ref 6–23)
CO2: 31 mEq/L (ref 19–32)
Calcium: 9.8 mg/dL (ref 8.4–10.5)
Chloride: 102 mEq/L (ref 96–112)
Creatinine, Ser: 0.9 mg/dL (ref 0.40–1.20)
GFR: 69.24 mL/min (ref >60.00)
Glucose, Bld: 88 mg/dL (ref 70–99)
Potassium: 4.3 mEq/L (ref 3.5–5.1)
Sodium: 138 mEq/L (ref 135–145)
Total Bilirubin: 0.8 mg/dL (ref 0.2–1.2)
Total Protein: 6.2 g/dL (ref 6.0–8.3)

## 2022-08-23 LAB — CBC WITH DIFFERENTIAL/PLATELET
Basophils Absolute: 0 10*3/uL (ref 0.0–0.1)
Basophils Relative: 1 % (ref 0.0–3.0)
Eosinophils Absolute: 0.1 10*3/uL (ref 0.0–0.7)
Eosinophils Relative: 2.1 % (ref 0.0–5.0)
HCT: 42.1 % (ref 36.0–46.0)
Hemoglobin: 14.3 g/dL (ref 12.0–15.0)
Lymphocytes Relative: 49 % — ABNORMAL HIGH (ref 12.0–46.0)
Lymphs Abs: 2.2 10*3/uL (ref 0.7–4.0)
MCHC: 34 g/dL (ref 30.0–36.0)
MCV: 88.6 fl (ref 78.0–100.0)
Monocytes Absolute: 0.3 10*3/uL (ref 0.1–1.0)
Monocytes Relative: 7.8 % (ref 3.0–12.0)
Neutro Abs: 1.8 10*3/uL (ref 1.4–7.7)
Neutrophils Relative %: 40.1 % — ABNORMAL LOW (ref 43.0–77.0)
Platelets: 247 10*3/uL (ref 150.0–400.0)
RBC: 4.75 Mil/uL (ref 3.87–5.11)
RDW: 13 % (ref 11.5–15.5)
WBC: 4.4 10*3/uL (ref 4.0–10.5)

## 2022-08-23 LAB — LIPID PANEL
Cholesterol: 236 mg/dL — ABNORMAL HIGH (ref 0–200)
HDL: 66.4 mg/dL (ref >39.00)
LDL Cholesterol: 150 mg/dL — ABNORMAL HIGH (ref 0–99)
NonHDL: 169.46
Total CHOL/HDL Ratio: 4
Triglycerides: 97 mg/dL (ref 0.0–149.0)
VLDL: 19.4 mg/dL (ref 0.0–40.0)

## 2022-10-11 ENCOUNTER — Ambulatory Visit: Payer: BC Managed Care – PPO

## 2022-10-11 ENCOUNTER — Ambulatory Visit
Admission: RE | Admit: 2022-10-11 | Discharge: 2022-10-11 | Disposition: A | Discharge status: Home / Self Care | Payer: BC Managed Care – PPO | Source: Ambulatory Visit | Attending: Family Medicine | Admitting: Family Medicine

## 2022-10-11 DIAGNOSIS — Z0000 Encounter for general adult medical examination without abnormal findings: Secondary | ICD-10-CM

## 2022-10-11 DIAGNOSIS — R928 Other abnormal and inconclusive findings on diagnostic imaging of breast: Secondary | ICD-10-CM

## 2022-10-24 ENCOUNTER — Ambulatory Visit
Admission: RE | Admit: 2022-10-24 | Discharge: 2022-10-24 | Disposition: A | Discharge status: Home / Self Care | Payer: BC Managed Care – PPO | Source: Ambulatory Visit | Attending: Family Medicine | Admitting: Family Medicine

## 2022-10-24 ENCOUNTER — Ambulatory Visit: Admission: RE | Admit: 2022-10-24 | Payer: BC Managed Care – PPO | Source: Ambulatory Visit

## 2022-10-24 DIAGNOSIS — R92322 Mammographic fibroglandular density, left breast: Secondary | ICD-10-CM | POA: Diagnosis not present

## 2023-01-08 ENCOUNTER — Ambulatory Visit: Payer: BC Managed Care – PPO | Admitting: Family Medicine

## 2023-01-17 ENCOUNTER — Encounter: Payer: Self-pay | Admitting: Family Medicine

## 2023-01-17 ENCOUNTER — Ambulatory Visit (INDEPENDENT_AMBULATORY_CARE_PROVIDER_SITE_OTHER): Payer: BC Managed Care – PPO | Admitting: Family Medicine

## 2023-01-17 VITALS — BP 110/70 | HR 52 | Temp 98.1°F | Ht 59.5 in | Wt 114.4 lb

## 2023-01-17 DIAGNOSIS — K644 Residual hemorrhoidal skin tags: Secondary | ICD-10-CM

## 2023-01-17 NOTE — Progress Notes (Signed)
Subjective  CC:  Chief Complaint  Patient presents with   referral     Pt would like to discuss a possible referral to GI regarding hemorrhoids     HPI: Mary Mahoney is a 62 y.o. female who presents to the office today to address the problems listed above in the chief complaint. Healthy 62 year old female presents due to concern of small pimple-like mass at the anal verge.  She noted while applying Preparation H due to active hemorrhoidal flare.  This was about a month ago.  Hemorrhoids are now calm down.  This is a chronic problem for her.  No complications.  However she has noticed a smooth beige-colored small bump and wants to make sure that it is okay.  No tenderness.  No bleeding.  Normal bowel movements. Colorectal cancer screening is up-to-date and normal  Assessment  1. Anal skin tag   2. External hemorrhoids without complication      Plan  Anal skin tag: Reassured.  Benign appearing.  Will follow-up if changes. Hemorrhoids: Manages conservatively with preparation H as needed.  Currently not active  Follow up: As needed Complete physical in March  No orders of the defined types were placed in this encounter.  No orders of the defined types were placed in this encounter.     I reviewed the patients updated PMH, FH, and SocHx.    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   Calcium Carb-Cholecalciferol (CALCIUM 600 + D PO) Take 1 tablet by mouth daily.   Chromium 200 MCG CAPS Take 1 capsule by mouth daily.   clobetasol ointment (TEMOVATE) 0.05 % Use a pea sized amount BID for up to 2 weeks as needed. Not for daily long term use.    Multiple Vitamin (MULTI-VITAMIN) tablet daily.  by Mary Mahoney CMA daily   naproxen sodium (ALEVE) 220 MG tablet Take 220 mg by mouth as needed.    Allergies: Patient has No Known Allergies. Family History: Patient family history includes COPD in her mother; Cancer in her father, paternal grandmother, and paternal uncle; Diabetes in her mother and sister; Hearing loss in her paternal grandmother; Heart attack in her mother; Heart disease in her maternal uncle and mother; Lung cancer in her father; Lung disease in her mother; Melanoma in her paternal uncle; Peripheral Artery Disease in her father; Stroke in her maternal uncle. Social History:  Patient  reports that she quit smoking about 41 years ago. Her smoking use included cigarettes. She started smoking about 43 years ago. She has a 1 pack-year smoking history. She has never used smokeless tobacco. She reports that she does not currently use alcohol. She reports that she does not use drugs.  Review of Systems: Constitutional: Negative for fever malaise or anorexia Cardiovascular: negative for chest pain Respiratory: negative for SOB or persistent cough Gastrointestinal: negative for abdominal pain  Objective  Vitals: BP 110/70   Pulse (!) 52   Temp 98.1 F (36.7 C)   Ht 4' 11.5" (1.511 m)   Wt 114 lb 6.4 oz (51.9 kg)   LMP 07/14/2011   SpO2 98%   BMI 22.72 kg/m  General:  no acute distress , A&Ox3 Rectal exam: No hemorrhoids or significant masses, at 11:00 at anal verge, small flesh tone skin tag, mobile smooth nontender  Commons side effects, risks, benefits, and alternatives for medications and treatment plan prescribed today were discussed, and the patient expressed understanding of the given instructions. Patient is instructed to call or message via MyChart if he/she has any questions or concerns regarding our treatment plan. No barriers to understanding were identified. We discussed Red Flag symptoms and signs in detail.  Patient expressed understanding regarding what to do in case of urgent or emergency type symptoms.  Medication list was reconciled, printed and provided to the patient in AVS. Patient instructions and summary information was reviewed with the patient as documented in the AVS. This note was prepared with assistance of Dragon voice recognition software. Occasional wrong-word or sound-a-like substitutions may have occurred due to the inherent limitations of voice recognition software

## 2023-01-22 DIAGNOSIS — U099 Post covid-19 condition, unspecified: Secondary | ICD-10-CM | POA: Diagnosis not present

## 2023-01-22 DIAGNOSIS — J069 Acute upper respiratory infection, unspecified: Secondary | ICD-10-CM | POA: Diagnosis not present

## 2023-07-24 ENCOUNTER — Ambulatory Visit (INDEPENDENT_AMBULATORY_CARE_PROVIDER_SITE_OTHER): Payer: BC Managed Care – PPO | Admitting: Obstetrics and Gynecology

## 2023-07-24 ENCOUNTER — Encounter: Payer: Self-pay | Admitting: Obstetrics and Gynecology

## 2023-07-24 VITALS — BP 112/62 | HR 59 | Temp 97.9°F | Ht 60.0 in | Wt 115.0 lb

## 2023-07-24 DIAGNOSIS — N958 Other specified menopausal and perimenopausal disorders: Secondary | ICD-10-CM | POA: Diagnosis not present

## 2023-07-24 DIAGNOSIS — Z9189 Other specified personal risk factors, not elsewhere classified: Secondary | ICD-10-CM | POA: Diagnosis not present

## 2023-07-24 DIAGNOSIS — N904 Leukoplakia of vulva: Secondary | ICD-10-CM | POA: Diagnosis not present

## 2023-07-24 DIAGNOSIS — Z01419 Encounter for gynecological examination (general) (routine) without abnormal findings: Secondary | ICD-10-CM | POA: Diagnosis not present

## 2023-07-24 MED ORDER — CLOBETASOL PROPIONATE 0.05 % EX OINT: TOPICAL_OINTMENT | CUTANEOUS | 1 refills | Status: AC

## 2023-07-24 MED ORDER — ESTRADIOL 0.1 MG/GM VA CREA: TOPICAL_CREAM | VAGINAL | 1 refills | Status: AC

## 2023-07-24 NOTE — Progress Notes (Addendum)
 63 y.o. 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 She has identical twins, 70 (female and transgender female). Has an adult son, local.   She has had issues with vaginal atrophy, not helped with vaginal estrogen. Not sexually active. Using vaginal dilators once a month.  Postmenopausal bleeding: none Pelvic discharge or pain: none Breast mass, nipple discharge or skin changes : none Last PAP:     Component Value Date/Time   DIAGPAP  07/11/2022 0926    - Negative for intraepithelial lesion or malignancy (NILM)   DIAGPAP  10/09/2017 0000    NEGATIVE FOR INTRAEPITHELIAL LESIONS OR MALIGNANCY.   HPVHIGH Negative 07/11/2022 0926   ADEQPAP  07/11/2022 0926    Satisfactory for evaluation. The presence or absence of an   ADEQPAP  07/11/2022 0926    endocervical/transformation zone component cannot be determined because   ADEQPAP of atrophy. 07/11/2022 4401   Last mammogram: 10/24/22 BI-RADS 1, density B Last colonoscopy:  09/22/14 f/u 10 years  Last DXA: never Sexually active: No Exercising: Yes. Trail running, resistance, cardio. Teaches body pump once a week. Smoker: No, former  GYN HISTORY: No significant history  OB History  Gravida Para Term Preterm AB Living  3 2 2  1 3   SAB IAB Ectopic Multiple Live Births   1  1 3     # Outcome Date GA Lbr Len/2nd Weight Sex Type Anes PTL Lv  3A Term 11/25/05    F Vag-Spont   LIV  3B Term 11/25/05    F Vag-Spont   LIV  2 Term 10/28/80    M Vag-Spont   LIV  1 IAB             Past Medical History:  Diagnosis Date   Anemia, as younger woman    Osteoarthritis    Separation of AC joint, left, s/p repair     Past Surgical History:  Procedure Laterality Date   ACROMIO-CLAVICULAR JOINT REPAIR Left 08/10/2017   Procedure: LEFT ACROMIO-CLAVICULAR JOINT REPAIR;  Surgeon: Tarry Kos, MD;  Location: [facility] OR;   Service: Orthopedics;  Laterality: Left;   COLONOSCOPY  09/22/2014   REFRACTIVE SURGERY Bilateral    TOTAL HIP ARTHROPLASTY Right 04/21/2015   Procedure: RIGHT TOTAL HIP ARTHROPLASTY ANTERIOR APPROACH;  Surgeon: Tarry Kos, MD;  Location: [facility] OR;  Service: Orthopedics;  Laterality: Right;    Current Outpatient Medications on File Prior to Visit  Medication Sig Dispense Refill   Ascorbic Acid (VITAMIN C) 1000 MG tablet Take 1,000 mg by mouth daily.     Calcium Carb-Cholecalciferol (CALCIUM 600 + D PO) Take 1 tablet by mouth daily.     Chromium 200 MCG CAPS Take 1 capsule by mouth daily.     Multiple Vitamin (MULTI-VITAMIN) tablet daily.  by Theophilus Bones CMA daily     naproxen sodium (ALEVE) 220 MG tablet Take 220 mg by mouth as needed.     No current facility-administered medications on file prior to visit.    Social History   Socioeconomic History   Marital status: Media planner    Spouse name: Not on file   Number of children: 2   Years of education: Not on file   Highest education level: Master's degree (e.g., MA, MS, MEng, MEd, MSW, MBA)  Occupational History   Occupation: ACCOUNTANT    Employer: [employer]  Tobacco Use  Smoking status: Former    Current packs/day: 0.00    Average packs/day: 0.5 packs/day for 2.0 years (1.0 ttl pk-yrs)    Types: Cigarettes    Start date: 11/11/1979    Quit date: 11/10/1981    Years since quitting: 41.7   Smokeless tobacco: Never   Tobacco comments:    smoked cigarettes for approx 2 years as a young adult  Vaping Use   Vaping status: Never Used  Substance and Sexual Activity   Alcohol use: Not Currently   Drug use: No   Sexual activity: Not Currently    Birth control/protection: Post-menopausal  Other Topics Concern   Not on file  Social History Narrative   Very active. Eats healthy food choices. Takes MVM. Twin girls   Social Drivers of Corporate investment banker Strain: Low Risk  (01/15/2023)   Overall  Financial Resource Strain (CARDIA)    Difficulty of Paying Living Expenses: Not hard at all  Food Insecurity: No Food Insecurity (01/15/2023)   Hunger Vital Sign    Worried About Running Out of Food in the Last Year: Never true    Ran Out of Food in the Last Year: Never true  Transportation Needs: No Transportation Needs (01/15/2023)   PRAPARE - Administrator, Civil Service (Medical): No    Lack of Transportation (Non-Medical): No  Physical Activity: Sufficiently Active (01/15/2023)   Exercise Vital Sign    Days of Exercise per Week: 5 days    Minutes of Exercise per Session: 60 min  Stress: Patient Declined (01/15/2023)   [employer] of Occupational Health - Occupational Stress Questionnaire    Feeling of Stress : Patient declined  Social Connections: Unknown (01/15/2023)   Social Connection and Isolation Panel [NHANES]    Frequency of Communication with Friends and Family: Once a week    Frequency of Social Gatherings with Friends and Family: Twice a week    Attends Religious Services: Patient declined    Database administrator or Organizations: Yes    Attends Engineer, structural: More than 4 times per year    Marital Status: Divorced  Intimate Partner Violence: Unknown (09/15/2021)   Received from [employer], [facility]   HITS    Physically Hurt: Not on file    Insult or Talk Down To: Not on file    Threaten Physical Harm: Not on file    Scream or Curse: Not on file    Family History  Problem Relation Age of Onset   Lung disease Mother    Heart attack Mother    Heart disease Mother    Diabetes Mother    COPD Mother    Peripheral Artery Disease Father    Lung cancer Father    Cancer Father    Melanoma Paternal Uncle    Cancer Paternal Uncle    Cancer Paternal Grandmother    Hearing loss Paternal Grandmother    Diabetes Sister    Heart disease Maternal Uncle    Stroke Maternal Uncle     No Known Allergies    PE Today's Vitals   07/24/23  1335  BP: 112/62  Pulse: (!) 59  Temp: 97.9 F (36.6 C)  TempSrc: Oral  SpO2: 98%  Weight: 115 lb (52.2 kg)  Height: 5' (1.524 m)   Body mass index is 22.46 kg/m.  Physical Exam Vitals reviewed. Exam conducted with a chaperone present.  Constitutional:      General: She is not in acute  distress.    Appearance: Normal appearance.  HENT:     Head: Normocephalic and atraumatic.     Nose: Nose normal.  Eyes:     Extraocular Movements: Extraocular movements intact.     Conjunctiva/sclera: Conjunctivae normal.  Neck:     Thyroid: No thyroid mass, thyromegaly or thyroid tenderness.  Pulmonary:     Effort: Pulmonary effort is normal.  Chest:     Chest wall: No mass or tenderness.  Breasts:    Right: Normal. No swelling, mass, nipple discharge, skin change or tenderness.     Left: Normal. No swelling, mass, nipple discharge, skin change or tenderness.  Abdominal:     General: There is no distension.     Palpations: Abdomen is soft.     Tenderness: There is no abdominal tenderness.  Genitourinary:    General: Normal vulva.     Exam position: Lithotomy position.     Urethra: No prolapse.     Vagina: Normal. No vaginal discharge or bleeding.     Cervix: Normal. No lesion.     Uterus: Normal. Not enlarged and not tender.      Adnexa: Right adnexa normal and left adnexa normal.     Comments: Vulvar atrophy Musculoskeletal:        General: Normal range of motion.     Cervical back: Normal range of motion.  Lymphadenopathy:     Upper Body:     Right upper body: No axillary adenopathy.     Left upper body: No axillary adenopathy.     Lower Body: No right inguinal adenopathy. No left inguinal adenopathy.  Skin:    General: Skin is warm and dry.  Neurological:     General: No focal deficit present.     Mental Status: She is alert.  Psychiatric:        Mood and Affect: Mood normal.        Behavior: Behavior normal.       Assessment and Plan:        Well woman exam with  routine gynecological exam Assessment & Plan: Cervical cancer screening performed according to ASCCP guidelines. Encouraged annual mammogram screening Colonoscopy UTD DXA: due given weight <127lb Labs and immunizations with her primary Encouraged safe sexual practices as indicated Encouraged healthy lifestyle practices with diet and exercise For patients under 50-70yo, I recommend 1200mg  calcium daily and 600IU of vitamin D daily.    Lichen sclerosus of female genitalia -     Clobetasol Propionate; Use a pea sized amount BID for up to 2 weeks as needed. Not for daily long term use.  Dispense: 30 g; Refill: 1  At risk for osteoporosis -     DG Bone Density; Future  Genitourinary syndrome of menopause -     Estradiol; Apply 1/2 gram to vulva nightly for 2 weeks then decrease to 1/2 gram to vulva two nights a week.  Dispense: 42.5 g; Refill: 1  Recommend dilator use weekly with vaginal estrogen. Considering applying aquaphor or coconut oil to the vulva after showers. You could also using daily vaginal moisturizers by brands like Good Clean Love and Ah! Yes.   Rosalyn Gess, MD

## 2023-07-24 NOTE — Assessment & Plan Note (Addendum)
Cervical cancer screening performed according to ASCCP guidelines. Encouraged annual mammogram screening Colonoscopy UTD DXA: due given weight <127lb Labs and immunizations with her primary Encouraged safe sexual practices as indicated Encouraged healthy lifestyle practices with diet and exercise For patients under 50-62yo, I recommend 1200mg  calcium daily and 600IU of vitamin D daily.

## 2023-07-24 NOTE — Patient Instructions (Addendum)
Considering applying aquaphor or coconut oil to the vulva after showers. You could also using daily vaginal moisturizers by brands like Good Clean Love and Ah! Yes.  For patients under 50-62yo, I recommend 1200mg  calcium daily and 600IU of vitamin D daily. For patients over 62yo, I recommend 1200mg  calcium daily and 800IU of vitamin D daily.  Health Maintenance, Female Adopting a healthy lifestyle and getting preventive care are important in promoting health and wellness. Ask your health care provider about: The right schedule for you to have regular tests and exams. Things you can do on your own to prevent diseases and keep yourself healthy. What should I know about diet, weight, and exercise? Eat a healthy diet  Eat a diet that includes plenty of vegetables, fruits, low-fat dairy products, and lean protein. Do not eat a lot of foods that are high in solid fats, added sugars, or sodium. Maintain a healthy weight Body mass index (BMI) is used to identify weight problems. It estimates body fat based on height and weight. Your health care provider can help determine your BMI and help you achieve or maintain a healthy weight. Get regular exercise Get regular exercise. This is one of the most important things you can do for your health. Most adults should: Exercise for at least 150 minutes each week. The exercise should increase your heart rate and make you sweat (moderate-intensity exercise). Do strengthening exercises at least twice a week. This is in addition to the moderate-intensity exercise. Spend less time sitting. Even light physical activity can be beneficial. Watch cholesterol and blood lipids Have your blood tested for lipids and cholesterol at 62 years of age, then have this test every 5 years. Have your cholesterol levels checked more often if: Your lipid or cholesterol levels are high. You are older than 62 years of age. You are at high risk for heart disease. What should I know  about cancer screening? Depending on your health history and family history, you may need to have cancer screening at various ages. This may include screening for: Breast cancer. Cervical cancer. Colorectal cancer. Skin cancer. Lung cancer. What should I know about heart disease, diabetes, and high blood pressure? Blood pressure and heart disease High blood pressure causes heart disease and increases the risk of stroke. This is more likely to develop in people who have high blood pressure readings or are overweight. Have your blood pressure checked: Every 3-5 years if you are 29-15 years of age. Every year if you are 55 years old or older. Diabetes Have regular diabetes screenings. This checks your fasting blood sugar level. Have the screening done: Once every three years after age 39 if you are at a normal weight and have a low risk for diabetes. More often and at a younger age if you are overweight or have a high risk for diabetes. What should I know about preventing infection? Hepatitis B If you have a higher risk for hepatitis B, you should be screened for this virus. Talk with your health care provider to find out if you are at risk for hepatitis B infection. Hepatitis C Testing is recommended for: Everyone born from [birth year] through 1964. Anyone with known risk factors for hepatitis C. Sexually transmitted infections (STIs) Get screened for STIs, including gonorrhea and chlamydia, if: You are sexually active and are younger than 62 years of age. You are older than 62 years of age and your health care provider tells you that you are at risk for this type  of infection. Your sexual activity has changed since you were last screened, and you are at increased risk for chlamydia or gonorrhea. Ask your health care provider if you are at risk. Ask your health care provider about whether you are at high risk for HIV. Your health care provider may recommend a prescription medicine to help prevent  HIV infection. If you choose to take medicine to prevent HIV, you should first get tested for HIV. You should then be tested every 3 months for as long as you are taking the medicine. Osteoporosis and menopause Osteoporosis is a disease in which the bones lose minerals and strength with aging. This can result in bone fractures. If you are 13 years old or older, or if you are at risk for osteoporosis and fractures, ask your health care provider if you should: Be screened for bone loss. Take a calcium or vitamin D supplement to lower your risk of fractures. Be given hormone replacement therapy (HRT) to treat symptoms of menopause. Follow these instructions at home: Alcohol use Do not drink alcohol if: Your health care provider tells you not to drink. You are pregnant, may be pregnant, or are planning to become pregnant. If you drink alcohol: Limit how much you have to: 0-1 drink a day. Know how much alcohol is in your drink. In the [place] one drink equals one 12 oz bottle of beer (355 mL), one 5 oz glass of wine (148 mL), or one 1 oz glass of hard liquor (44 mL). Lifestyle Do not use any products that contain nicotine or tobacco. These products include cigarettes, chewing tobacco, and vaping devices, such as e-cigarettes. If you need help quitting, ask your health care provider. Do not use street drugs. Do not share needles. Ask your health care provider for help if you need support or information about quitting drugs. General instructions Schedule regular health, dental, and eye exams. Stay current with your vaccines. Tell your health care provider if: You often feel depressed. You have ever been abused or do not feel safe at home. Summary Adopting a healthy lifestyle and getting preventive care are important in promoting health and wellness. Follow your health care provider's instructions about healthy diet, exercising, and getting tested or screened for diseases. Follow your health care  provider's instructions on monitoring your cholesterol and blood pressure. This information is not intended to replace advice given to you by your health care provider. Make sure you discuss any questions you have with your health care provider. Document Revised: 10/18/2020 Document Reviewed: 10/18/2020 Elsevier Patient Education  2024 [employer].

## 2023-08-24 ENCOUNTER — Ambulatory Visit (HOSPITAL_BASED_OUTPATIENT_CLINIC_OR_DEPARTMENT_OTHER)
Admission: RE | Admit: 2023-08-24 | Discharge: 2023-08-24 | Disposition: A | Discharge status: Home / Self Care | Payer: BC Managed Care – PPO | Source: Ambulatory Visit | Attending: Obstetrics and Gynecology | Admitting: Obstetrics and Gynecology

## 2023-08-24 DIAGNOSIS — S72001A Fracture of unspecified part of neck of right femur, initial encounter for closed fracture: Secondary | ICD-10-CM | POA: Diagnosis not present

## 2023-08-24 DIAGNOSIS — Z9189 Other specified personal risk factors, not elsewhere classified: Secondary | ICD-10-CM | POA: Insufficient documentation

## 2023-09-04 ENCOUNTER — Encounter: Payer: Self-pay | Admitting: Obstetrics and Gynecology

## 2024-06-19 ENCOUNTER — Ambulatory Visit: Payer: Self-pay

## 2024-06-19 NOTE — Telephone Encounter (Signed)
 noted

## 2024-06-19 NOTE — Telephone Encounter (Signed)
 FYI Only or Action Required?: Action required by provider: update on patient condition.  Patient was last seen in primary care on 01/17/2023 by Jodie Lavern CROME, MD.  Called Nurse Triage reporting Influenza.  Symptoms began several weeks ago.  Interventions attempted: OTC medications: tylenol ; cough medicine and Rest, hydration, or home remedies.  Symptoms are: gradually improving.  Triage Disposition: Home Care  Patient/caregiver understands and will follow disposition?: Yes     Copied from CRM #[id number]. Topic: Clinical - Red Word Triage >> Jun 19, 2024 10:21 AM Richerd A wrote: [facility] that prompted transfer to Nurse Triage: Patient has Flu since 05/31/24 has body aches, fever and chills,headaches,congestion sore throat from drainage    Reason for Disposition  Influenza, questions about  Answer Assessment - Initial Assessment Questions Pt called in requesting home care for flu symptoms. Pt states she was in [place] and started to develop a cough 05/30/24 and when she got home took an at home covid/flu test that was positive for Flu A. Pt states she has managed symptoms at home with otc cough medication and tylenol  PRN. Pt states she does still have body aches and chills but no fever. Pt denies any distress and okay with home care at this time. Discussed importance of hydration and electrolytes, encouraged pt to continue otc medications for symptom relief PRN. Pt voiced appreciation.      1. DIAGNOSIS CONFIRMATION: When was the influenza diagnosed? By whom? Did you get a test for it?     05/31/24 via home covid/flu test; positive for flu A   2. MEDICINES: Were you prescribed any medications for the influenza?  (e.g., zanamivir [Relenza], oseltamavir [Tamiflu]).      N/a   3. ONSET of SYMPTOMS: When did your symptoms start?     05/30/24  4. SYMPTOMS: What symptoms are you most concerned about? (e.g., runny nose, stuffy nose, sore throat, cough, breathing difficulty,  fever)     Body aches, chills, cough, h/a, congestion with sore throat   5. COUGH: How bad is the cough?     Minimal   6. FEVER: Do you have a fever? If Yes, ask: What is your temperature, how was it measured, and when did it start?     No; pt reports low grade fever at beginning with initial symptoms   7. RESPIRATORY DISTRESS: Are you having any trouble breathing? If Yes, ask: Describe your breathing.      No  Protocols used: Influenza Follow-up Call-A-[facility]
